# Patient Record
Sex: Male | Born: 1975
Health system: Southern US, Community
[De-identification: ages and names within clinical notes are randomized; demographics above are authoritative.]

## PROBLEM LIST (undated history)

## (undated) DIAGNOSIS — F259 Schizoaffective disorder, unspecified: Secondary | ICD-10-CM

## (undated) HISTORY — DX: Schizoaffective disorder, unspecified: F25.9

## (undated) HISTORY — PX: NO PAST SURGERIES: SHX2092

---

## 2007-03-09 ENCOUNTER — Other Ambulatory Visit: Payer: Self-pay

## 2007-03-10 ENCOUNTER — Inpatient Hospital Stay (HOSPITAL_COMMUNITY): Admission: AD | Admit: 2007-03-10 | Discharge: 2007-03-15 | Payer: Self-pay | Admitting: Psychiatry

## 2007-03-10 ENCOUNTER — Ambulatory Visit: Payer: Self-pay | Admitting: Psychiatry

## 2008-05-10 ENCOUNTER — Encounter: Payer: Self-pay | Admitting: Pulmonary Disease

## 2008-05-20 ENCOUNTER — Encounter: Payer: Self-pay | Admitting: Pulmonary Disease

## 2008-05-25 ENCOUNTER — Encounter: Payer: Self-pay | Admitting: Pulmonary Disease

## 2008-06-22 ENCOUNTER — Encounter: Payer: Self-pay | Admitting: Pulmonary Disease

## 2008-11-10 ENCOUNTER — Encounter: Payer: Self-pay | Admitting: Pulmonary Disease

## 2009-10-10 ENCOUNTER — Encounter: Payer: Self-pay | Admitting: Pulmonary Disease

## 2009-11-29 ENCOUNTER — Ambulatory Visit: Payer: Self-pay | Admitting: Pulmonary Disease

## 2009-11-29 DIAGNOSIS — R0989 Other specified symptoms and signs involving the circulatory and respiratory systems: Secondary | ICD-10-CM

## 2009-11-29 DIAGNOSIS — G4733 Obstructive sleep apnea (adult) (pediatric): Secondary | ICD-10-CM

## 2009-11-29 DIAGNOSIS — R0609 Other forms of dyspnea: Secondary | ICD-10-CM

## 2009-11-29 DIAGNOSIS — E785 Hyperlipidemia, unspecified: Secondary | ICD-10-CM

## 2009-11-29 DIAGNOSIS — K219 Gastro-esophageal reflux disease without esophagitis: Secondary | ICD-10-CM

## 2009-11-29 DIAGNOSIS — F259 Schizoaffective disorder, unspecified: Secondary | ICD-10-CM | POA: Insufficient documentation

## 2009-12-13 DIAGNOSIS — J984 Other disorders of lung: Secondary | ICD-10-CM

## 2009-12-21 ENCOUNTER — Encounter: Payer: Self-pay | Admitting: Pulmonary Disease

## 2010-01-02 ENCOUNTER — Ambulatory Visit: Payer: Self-pay | Admitting: Pulmonary Disease

## 2010-01-11 ENCOUNTER — Ambulatory Visit (HOSPITAL_COMMUNITY)
Admission: RE | Admit: 2010-01-11 | Discharge: 2010-01-11 | Payer: Self-pay | Source: Home / Self Care | Attending: Pulmonary Disease | Admitting: Pulmonary Disease

## 2010-01-19 ENCOUNTER — Encounter: Payer: Self-pay | Admitting: Pulmonary Disease

## 2010-01-20 ENCOUNTER — Telehealth: Payer: Self-pay | Admitting: Pulmonary Disease

## 2010-01-23 ENCOUNTER — Encounter: Payer: Self-pay | Admitting: Pulmonary Disease

## 2010-02-14 NOTE — Letter (Signed)
Summary: [facility]   Imported By: Lester Blackshear 12/19/2009 08:00:46  _____________________________________________________________________  External Attachment:    Type:   Image     Comment:   External Document

## 2010-02-14 NOTE — Letter (Signed)
Summary: [facility]  [facility]   Imported By: Lester Cornell 12/19/2009 07:55:44  _____________________________________________________________________  External Attachment:    Type:   Image     Comment:   External Document

## 2010-02-14 NOTE — Letter (Signed)
Summary: [facility]  [address]   [place], [place] [address]   Phone: [phone]  Fax: [phone]    12/21/2009  Bobbi Kitner [address] [place], [place]  [address]  Dear Mr. Lasala,  Our office has attempted to call you at your home phone number multiple times and have been unable to reach you. If you would please give our office a call back as soon as posiible. Our office hours are from 8:00 a.m. to 5:30 p.m. Monday through Friday.          Sincerely,   [employer]

## 2010-02-16 NOTE — Assessment & Plan Note (Signed)
Summary: extended followup visit for management of osa   Copy to:  self referral Primary Provider/Referring Provider:  Dr. Carlota Raspberry - [facility]  CC:  Follow up appt.  Has decreased smoking down to 2 to 3 cigs daily.  Pt states breathing has improved since last ov.   Pt states he is also here to discuss sleep issues..  History of Present Illness: The pt comes in today for evaluation of his osa.  Last visit, we discussed his pulmonary issues, and he feels his breathing is much better since he has cut back on smoking.  The pt was diagnosed with severe osa in 2010 with AHI 49/hr, and was ultimately titrated to bilevel pressure of 10/6 in order to control obstructive events and reduce pressure induced central apneas.  He has been compliant with bipap since starting, and feels it has helped his sleep and daytime alertness.  He is using a full face mask that he feels fits adequately, and has a heated humidifier.  He typically goes to bed 11-102mn, and arises at 11-12nn to start his day.  He has significant daytime sleepiness that he believes is due to his sedating meds.  He uses provigil to help with his daytime sleepiness, but believes the bipap has helped as well.  Once he is up and moving, he does not doze.  He felt he was much more rested before he got started on his current meds.  The pt states that his weight is up about 10 pounds since his sleep study.  His epworth score today is 7.    Current Medications (verified): 1)  Lithium Carbonate 450 Mg Cr-Tabs (Lithium Carbonate) .... Take 1 Tablet By Mouth Two Times A Day 2)  Clozaril 100 Mg Tabs (Clozapine) .... As Directed 3)  Levothyroxine Sodium 50 Mcg Tabs (Levothyroxine Sodium) .... Take 1 Tablet By Mouth Once A Day 4)  Divalproex Sodium 500 Mg Tbec (Divalproex Sodium) .... 2 in Am and 3 At Bedtime 5)  Nexium 40 Mg Cpdr (Esomeprazole Magnesium) .... Take 1 Tablet By Mouth Once A Day 6)  Nac 600 600 Mg Caps (Acetylcysteine) ....  2 Two Times A Day 7)  Aspirin 500 Mg Tbec (Aspirin) .... Take 1 Tablet By Mouth Two Times A Day 8)  Propranolol Hcl 40 Mg Tabs (Propranolol Hcl) .... 2 Two Times A Day 9)  Provigil 200 Mg  Tabs (Modafinil) .... By Mouth Daily  Allergies (verified): No Known Drug Allergies  Past History:  Past medical, surgical, family and social histories (including risk factors) reviewed, and no changes noted (except as noted below).  Past Medical History: Reviewed history from 11/29/2009 and no changes required. ACID REFLUX DISEASE (ICD-530.81) SCHIZOAFFECTIVE DISORDER (ICD-295.70) OBSTRUCTIVE SLEEP APNEA (ICD-327.23)--also central apneas as well. AHI 49/hr 2010 HYPERLIPIDEMIA (ICD-272.4)  Past Surgical History: Reviewed history from 11/29/2009 and no changes required. none  Family History: Reviewed history from 11/29/2009 and no changes required. mother - kidney cancer PGF - deceased from lung cancer PGM - deceased from pancreatic cancer  Social History: Reviewed history from 11/29/2009 and no changes required. Current Smoker.  smoking 4-5 cig/day.  Currently down to 2 to 3 cigs a day.   Started at age 86 Smokeless Tobacco - 2 cans per day.  Started 10/2009 Married Lives with wife and 4 children Disability  Review of Systems       The patient complains of shortness of breath with activity, nasal congestion/difficulty breathing through nose, anxiety, and depression.  The patient  denies shortness of breath at rest, productive cough, non-productive cough, coughing up blood, chest pain, irregular heartbeats, acid heartburn, indigestion, loss of appetite, weight change, abdominal pain, difficulty swallowing, sore throat, tooth/dental problems, headaches, sneezing, itching, ear ache, hand/feet swelling, joint stiffness or pain, rash, change in color of mucus, and fever.    Vital Signs:  Patient profile:   35 year old male Height:      70 inches Weight:      245.25 pounds BMI:     35.32 O2  Sat:      98 % on Room air Temp:     97.8 degrees F oral Pulse rate:   83 / minute BP sitting:   124 / 76  (left arm) Cuff size:   large  Vitals Entered By: Arman Filter LPN (January 02, 2010 2:12 PM)  O2 Flow:  Room air CC: Follow up appt.  Has decreased smoking down to 2 to 3 cigs daily.  Pt states breathing has improved since last ov.   Pt states he is also here to discuss sleep issues. Comments Medications reviewed with patient Arman Filter LPN  January 02, 2010 2:12 PM    Physical Exam  General:  obese male in nad Nose:  no skin breakdown or pressure necrosis from cpap mask no drainage noted. Mouth:  no lesions or exudates seen Lungs:  clear to auscultation Heart:  rrr Extremities:  no edema or cyanosis  Neurologic:  appears sleepy, but oriented, moves all 4.   Impression & Recommendations:  Problem # 1:  OBSTRUCTIVE SLEEP APNEA (ICD-327.23) the pt has a h/o severe osa, and has been wearing bipap complaintly by his history.  He is comfortable with his mask, and has no issues with pressure tolerance.  He has seen improvement since being on bipap, but feels that his sleepiness has significantly worsened since being on his new meds.  I have asked the pt to stay on his pressure device and work aggressively on weight loss.  If he feels that he is not sleeping well, we can recheck his pressure with an auto device.  He understands that adjustments to his bipap is not going to affect his sleepiness during the day from his psychiatric meds.  Problem # 2:  PULMONARY NODULE (ICD-518.89) pt is overdue for f/u ct chest.  Will set up.  Other Orders: Est. Patient Level V ([address]) Radiology Referral (Radiology)  Patient Instructions: 1)  will continue bipap at current level, and get download for review.  If you feel you are not doing well, we can recheck pressures at sleep center.  Working on weight loss will help 2)  stay off cigarettes as much as possible. 3)  followup with me in  6mos, but call if having sleep or breathing issues. 4)  will set up for ct chest at [facility] at your convenience.  Will call you with results.

## 2010-02-16 NOTE — Assessment & Plan Note (Signed)
Summary: self referral for doe   Copy to:  self referral Primary Jowana Thumma/Referring Danissa Rundle:  Dr. Carlota Raspberry - [facility]  CC:  shortness of breath.  History of Present Illness: the pt is a 35 y/o male who comes in today as a self referral for evaluation of dyspnea.  He has had doe for at least the last two years, and states the it occurs primarily with more moderate to heavy activities.  He has no issues with walking on flat ground, bringing groceries in from the car, or household chores.  He will have issues with stairs, hills, and carrying heavier loads.  He denies significant cough or congestion, and states these have improved since decreased smoking.  He currently is trying to quit.  He has been tried on inhalers in the past, and did not see a big difference.  Albuterol alone as needed helped in some circumstances.  He has had full pfts last year which showed no airflow obstruction, essentially normal lung volumes, and a mild decrease in dlco that corrects for alveolar volume.  He had a ct chest in 2010 that did show only a 5mm nodule in the RML.    Preventive Screening-Counseling & Management  Alcohol-Tobacco     Smoking Status: current     Smoking Cessation Counseling: yes     Packs/Day: 0.25     Tobacco Counseling: to quit use of tobacco products  Current Medications (verified): 1)  Lithium Carbonate 450 Mg Cr-Tabs (Lithium Carbonate) .... Take 1 Tablet By Mouth Two Times A Day 2)  Clozaril 100 Mg Tabs (Clozapine) .... As Directed 3)  Levothyroxine Sodium 50 Mcg Tabs (Levothyroxine Sodium) .... Take 1 Tablet By Mouth Once A Day 4)  Divalproex Sodium 500 Mg Tbec (Divalproex Sodium) .... 2 in Am and 3 At Bedtime 5)  Provigil 200 Mg Tabs (Modafinil) .... Take 1 Tablet By Mouth Every Morning As Needed 6)  Nexium 40 Mg Cpdr (Esomeprazole Magnesium) .... Take 1 Tablet By Mouth Once A Day 7)  Nac 600 600 Mg Caps (Acetylcysteine) .... 2 Two Times A Day 8)  Aspirin 500  Mg Tbec (Aspirin) .... Take 1 Tablet By Mouth Two Times A Day 9)  Propranolol Hcl 40 Mg Tabs (Propranolol Hcl) .... 2 Two Times A Day 10)  Astepro 0.15 % Soln (Azelastine Hcl) .... As Needed  Allergies (verified): No Known Drug Allergies  Past History:  Past Medical History: ACID REFLUX DISEASE (ICD-530.81) SCHIZOAFFECTIVE DISORDER (ICD-295.70) OBSTRUCTIVE SLEEP APNEA (ICD-327.23)--also central apneas as well. AHI 49/hr 2010 HYPERLIPIDEMIA (ICD-272.4)  Past Surgical History: none  Family History: Reviewed history and no changes required. mother - kidney cancer PGF - deceased from lung cancer PGM - deceased from pancreatic cancer  Social History: Reviewed history and no changes required. Current Smoker.  Currently smoking 4-5 cig/day.  Started at age 33 Smokeless Tobacco - 2 cans per day.  Started 10/2009 Married Lives with wife and 4 children Disability Smoking Status:  current Packs/Day:  0.25  Review of Systems       The patient complains of shortness of breath with activity, productive cough, acid heartburn, indigestion, nasal congestion/difficulty breathing through nose, and anxiety.  The patient denies shortness of breath at rest, non-productive cough, coughing up blood, chest pain, loss of appetite, weight change, abdominal pain, difficulty swallowing, sore throat, tooth/dental problems, headaches, sneezing, itching, ear ache, depression, hand/feet swelling, joint stiffness or pain, rash, and change in color of mucus.    Vital Signs:  Patient  profile:   35 year old male Height:      70 inches Weight:      240.13 pounds BMI:     34.58 O2 Sat:      98 % on Room air Temp:     98.3 degrees F oral Pulse rate:   74 / minute BP sitting:   130 / 82  (left arm) Cuff size:   regular  Vitals Entered By: Gweneth Dimitri RN (November 29, 2009 11:47 AM)  O2 Flow:  Room air CC: shortness of breath Comments Medications reviewed with patient Daytime contact number verified  with patient. Gweneth Dimitri RN  November 29, 2009 11:47 AM    Physical Exam  General:  ow male in nad Eyes:  PERRLA and EOMI.   Nose:  turbinate hypertrophy Mouth:  long palate and uvula Neck:  no jvd, tmg, LN Lungs:  totally clear to auscultation, no wheezing or rhonchi Heart:  rrr,no mrg Abdomen:  soft and nontender, bs+ Extremities:  no edema or cyanosis , pulses intact distally Neurologic:  alert and oriented, moves all 4.   Impression & Recommendations:  Problem # 1:  DYSPNEA ON EXERTION (ICD-786.09) the pt has doe that I suspect is multifactorial.  He is overweight and deconditioned, and may have airway inflammation related to his ongoing smoking.  He does not have airflow obstruction by his pfts in 2010, and his last ct in 2010 did not show any obvious abnormality which would contribute to his symptoms.  At this point, I have encouraged the pt to work aggressively on weight loss and some type of conditioning program.  I also have asked him to stop smoking 100%.  Problem # 2:  PULMONARY NODULE (ICD-518.89) the pt had a 5mm nodule in RML on ct from 2010.  This will need followup, and can be arranged at next visit.  Medications Added to Medication List This Visit: 1)  Lithium Carbonate 450 Mg Cr-tabs (Lithium carbonate) .... Take 1 tablet by mouth two times a day 2)  Clozaril 100 Mg Tabs (Clozapine) .... As directed 3)  Levothyroxine Sodium 50 Mcg Tabs (Levothyroxine sodium) .... Take 1 tablet by mouth once a day 4)  Divalproex Sodium 500 Mg Tbec (Divalproex sodium) .... 2 in am and 3 at bedtime 5)  Provigil 200 Mg Tabs (Modafinil) .... Take 1 tablet by mouth every morning as needed 6)  Nexium 40 Mg Cpdr (Esomeprazole magnesium) .... Take 1 tablet by mouth once a day 7)  Nac 600 600 Mg Caps (Acetylcysteine) .... 2 two times a day 8)  Aspirin 500 Mg Tbec (Aspirin) .... Take 1 tablet by mouth two times a day 9)  Propranolol Hcl 40 Mg Tabs (Propranolol hcl) .... 2 two times a  day 10)  Astepro 0.15 % Soln (Azelastine hcl) .... As needed 11)  Provigil 200 Mg Tabs (Modafinil) .... By mouth daily  Other Orders: New Patient Level IV (04540) Tobacco use cessation intermediate 3-10 minutes (98119)  Patient Instructions: 1)  stay off inhalers for now, but stop smoking completely when you are able. 2)  work on weight loss and conditioning. 3)  will arrange followup visit in the next few weeks to discuss your sleep issues.   Prescriptions: PROVIGIL 200 MG  TABS (MODAFINIL) By mouth daily  #30 x 0   Entered and Authorized by:   Barbaraann Share MD   Signed by:   Barbaraann Share MD on 11/29/2009   Method used:   Print  then Give to Patient   RxID:   [id number]     Appended Document: self referral for doe please let pt know his disk kept crashing our system.  He had a tiny spot on the ct from over a year ago....this needs a followup scan anyway.  We need to schedule him for a followup ct chest here in [place] if possible.  See what time or day may be best for him.  Appended Document: self referral for doe lmomtcb x1  Appended Document: self referral for doe lmomtcb x2  Appended Document: self referral for doe lmomtcb x3   Appended Document: self referral for doe atempt to call pt. i called twice and someone kept hanging up. will sned a letter out to pt's home stating for him to call us.   Appended Document: self referral for doe pt called back and informed him. pt verbalized understanding. Pt states he is able to do this anytime after 12/30/2009. Pt was informed our pcc should be contacting him with furthur details of date and timre. pt verbalized understanding

## 2010-02-16 NOTE — Progress Notes (Signed)
Summary: CXR results  Phone Note Call from Patient   Caller: Patient Call For: Dr. Shelle Iron Summary of Call: Patient phoned stated that he had a chest xray last week and wants to know if we have the results yet. Patient can be reached [phone] Initial call taken by: Vedia Coffer,  January 20, 2010 3:58 PM  Follow-up for Phone Call        LMTCBx1 to advsie of ct results per append. Carron Curie CMA  January 20, 2010 4:30 PM  Pt informed of 01/12/11 append of CXR. Zackery Barefoot CMA  January 23, 2010 2:47 PM

## 2010-02-16 NOTE — Letter (Signed)
Summary: [facility]  [address]   [place], [place] [address]   Phone: [phone]  Fax: [phone]    01/19/2010  Essa Visscher [address] [place], [place]  [address]  Dear Mr. Joo,     We have attempted to contact you by phone but have been unable to reach you.  Please call our office at your earliest convenience so that we may discuss your test results with you.  Thank you.       Sincerely,   Marcelyn Bruins, M.D.

## 2010-05-30 NOTE — H&P (Signed)
Matthew Reed, Matthew Reed NO.:  [id number]   MEDICAL RECORD NO.:  [id number]          PATIENT TYPE:  IPS   LOCATION:  0406                          FACILITY:  [facility]   PHYSICIAN:  Jasmine Pang, M.D. DATE OF BIRTH:  15-Apr-1975   DATE OF ADMISSION:  03/10/2007  DATE OF DISCHARGE:                       PSYCHIATRIC ADMISSION ASSESSMENT   HISTORY OF PRESENT ILLNESS:  The patient presents with a history of  anxiety and obsessive thoughts and his sleep has been decreased.  He  reports becoming suicidal and having homicidal thoughts towards his  wife.  He does state that he went ahead and called 9-1-1 because he knew  he needed help.  He has been feeling very anxious, having some recent  medication changes where his Depakote was decreased and had an increase  in his Luvox dose.  The patient reports having some recent alcohol use.  He is endorsing problems of social anxiety and is currently denying any  hallucinations.   PAST PSYCHIATRIC HISTORY:  First admission to the [facility].  He sees Dr. Jennelle Human for outpatient mental health services.  He  has a history of overdosing on Tegretol 1 year ago and was hospitalized  at [facility].   SOCIAL HISTORY:  He is a 35 year old married male and has 3 children  ages 72, 2 and 1.  He lives with his wife and 3 children.  The patient is  on disability for psychiatric illness.   FAMILY HISTORY:  Family history is positive for substance use.   ALCOHOL AND DRUG HISTORY:  The patient smokes.  Again, reports some  recent alcohol use and has a past history of using benzodiazepines.   PRIMARY CARE PHYSICIAN:  Primary care France Noyce is [facility].   PAST MEDICAL HISTORY:  Acid reflux and a hiatal hernia.   MEDICATIONS:  1. He has been on Depakote 500 mg two in the morning and three at      bedtime.  2. Nexium 40 mg daily.  3. Zyprexa 20 mg at bedtime.  4. Fluvoxamine 100 mg b.i.d.   DRUG  ALLERGIES:  No known allergies.   PHYSICAL EXAM:  GENERAL:  This is a young male.  He appears in no acute  distress.  He was assessed at [facility] emergency department.  VITAL SIGNS:  Temperature 98.4, 94 heart rate, 20 respirations, blood  pressure is 110/74, 99% saturated, 235 pounds.   LABORATORY DATA:  Glucose of 107.  CBC within normal limits.  Urine drug  screen is negative.  Alcohol level of 113.   MENTAL STATUS EXAM:  He is fully alert, cooperative, fair eye contact,  casually dressed.  Speech is clear, normal pace and tone.  The patient's  mood is anxious.  The patient does exhibit some mild anxiety.  Thought  processes are coherent and goal directed.  No evidence of any delusional  thinking.  Cognitive function intact.  His memory is good.  Judgment and  insight fair.  He is wanting help with his symptoms.   AXIS I:  Schizoaffective disorder bipolar type.  AXIS II:  Deferred.  AXIS III:  Acid reflux.  AXIS IV:  Psychosocial problems related to burden of illness.  AXIS V:  Current is 35.   PLAN:  Contract for safety.  Stabilize his mood and thinking.  We will  decrease Luvox to 150 mg daily.  We will initiate Risperdal for anxiety  and obsessive thinking, his racing thoughts.  Will contact wife for  background, support and concerns.  The patient is to follow up with Dr.  Jennelle Human and continue to be medication compliant and adhere to his  appointments.  His tentative length of stay is 3-5 days.      Landry Corporal, N.P.      Jasmine Pang, M.D.  Electronically Signed    JO/MEDQ  D:  03/11/2007  T:  03/12/2007  Job:  [id number]

## 2010-06-02 NOTE — Discharge Summary (Signed)
Matthew Reed, Matthew Reed NO.:  [id number]   MEDICAL RECORD NO.:  [id number]          PATIENT TYPE:  IPS   LOCATION:  0406                          FACILITY:  [facility]   PHYSICIAN:  Anselm Jungling, MD  DATE OF BIRTH:  28-Nov-1975   DATE OF ADMISSION:  03/10/2007  DATE OF DISCHARGE:  03/15/2007                               DISCHARGE SUMMARY   IDENTIFYING DATA AND REASON FOR ADMISSION:  This was the first  [facility] admission for Matthew Reed, a 35 year old married  white male, disabled, with a history of schizophrenia.  He is a patient  of Dr. Jennelle Human.  He presented with an increase in his symptoms, including  anxiety, insomnia, and irritability, which followed a decrease in his  Depakote dose at the instruction of his psychiatrist.  His medication  regimen included Depakote, Zyprexa, and Luvox.  Please refer to the  admission note for further details pertaining to the symptoms,  circumstances, and history that led to his hospitalization.  He was  given an initial Axis I diagnosis of bipolar disorder, NOS.   MEDICAL AND LABORATORY:  The patient was medically and physically  assessed by the psychiatric nurse practitioner.  He came to [place] with a  history of GERD, and had been taking Nexium.  Protonix was substituted.  Other than this, there were no significant medical issues.   HOSPITAL COURSE:  The patient was admitted to the adult inpatient  psychiatric service.  He presented as a well-nourished, well-developed  male who was alert, fully oriented, with good insight.  He made no  delusional statements.  He denied any active auditory hallucinations.  He was pleasant, but his mood was depressed with grim affect.  He  verbalized a strong desire for help.   The patient's Depakote level was measured and found to be  subtherapeutic.  His dose was increased to 3000 mg daily.  He was not  treated with Zyprexa, indicating that he felt that this had been  unsatisfactory  in the past.  Instead, he was treated with Seroquel, and  dosage was adjusted to a final level of 25 mg t.i.d. and 100 mg q.h.s.   The patient participated in various therapeutic groups and activities  and was a good participant.  There was a family session that occurred on  the third hospital day that involved his wife.  She was very supportive.  Aftercare plans were discussed at length.   The patient appeared appropriate for discharge on the sixth hospital  day.  He agreed to the following aftercare plan.   AFTERCARE:  The patient was to follow up with Dr. Jennelle Human at the  [facility], with an appointment on March 25, 2007 at  3:45 p.m.   DISCHARGE MEDICATIONS:  1. Depakote ER 3000 mg q.h.s.  2. Seroquel 25 mg b.i.d. and 100 mg q.h.s.  3. Nexium 40 mg daily.   DISCHARGE DIAGNOSES:   AXIS I:  Bipolar affective disorder, most recently depressed without  psychotic features.   AXIS II:  Deferred.   AXIS III:  History of gastroesophageal reflux  disease.   AXIS IV:  Stressors, severe.   AXIS V:  GAF on discharge 60.      Anselm Jungling, MD  Electronically Signed     SPB/MEDQ  D:  03/16/2007  T:  03/17/2007  Job:  [id number]

## 2010-10-09 LAB — VALPROIC ACID LEVEL
Valproic Acid Lvl: 42.6 — ABNORMAL LOW
Valproic Acid Lvl: 78.4
Valproic Acid Lvl: 87.1

## 2010-10-09 LAB — CBC
HCT: 45.7
Hemoglobin: 16.1
MCV: 90.2
Platelets: 164

## 2010-10-09 LAB — RAPID URINE DRUG SCREEN, HOSP PERFORMED
Amphetamines: NOT DETECTED
Benzodiazepines: NOT DETECTED
Cocaine: NOT DETECTED
Opiates: NOT DETECTED

## 2010-10-09 LAB — BASIC METABOLIC PANEL
Calcium: 9.5
Chloride: 105
Creatinine, Ser: 0.94
GFR calc Af Amer: >60
Potassium: 4.1
Sodium: 141

## 2010-10-09 LAB — DIFFERENTIAL
Basophils Absolute: 0.1
Lymphocytes Relative: 45
Lymphs Abs: 2.1
Monocytes Absolute: 0.4
Neutrophils Relative %: 45

## 2010-10-09 LAB — ETHANOL: Alcohol, Ethyl (B): 113 — ABNORMAL HIGH

## 2012-06-10 IMAGING — CT CT CHEST W/O CM
2 of 3 series · 15 of 36 positions shown, 18 images · non-contrast
Comparison: None

CLINICAL DATA: Evaluate right middle lobe nodule.

CT CHEST WITHOUT CONTRAST
TECHNIQUE: Multidetector CT imaging of the chest was performed
following the standard protocol without IV contrast.

[Series 2: chestroutine 5.0 b40f · axial · 0.74mm/px · z∈[-314,-68]mm · 12 of 59 slices shown, 15 images]
[im 5/59  mediastinal]
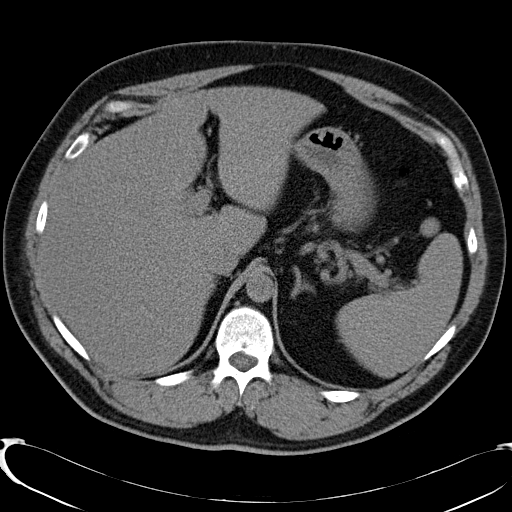
[im 5/59  lung]
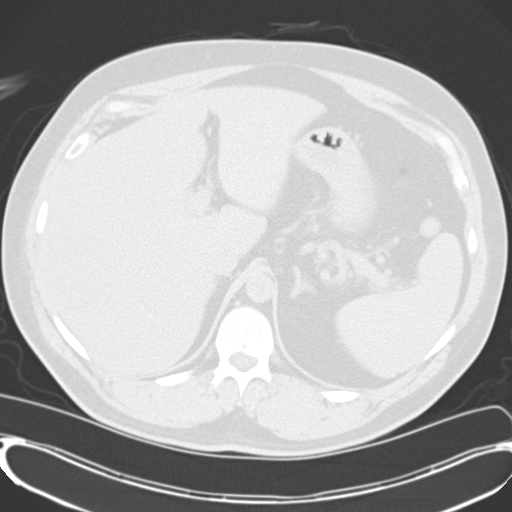
[im 9/59  lung]
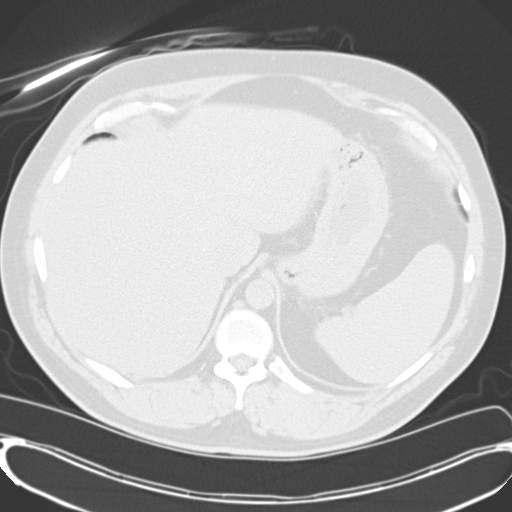
[im 13/59  lung]
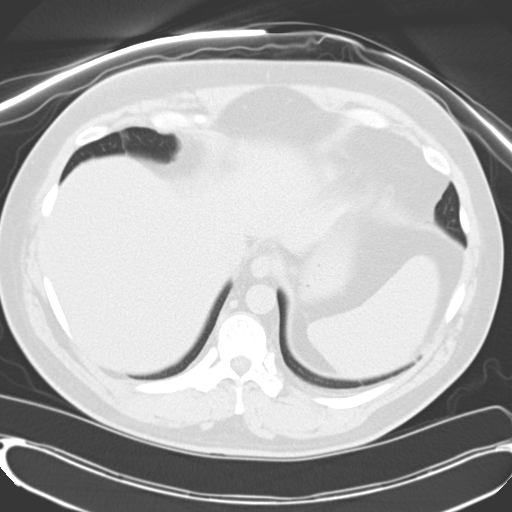
[im 18/59  lung]
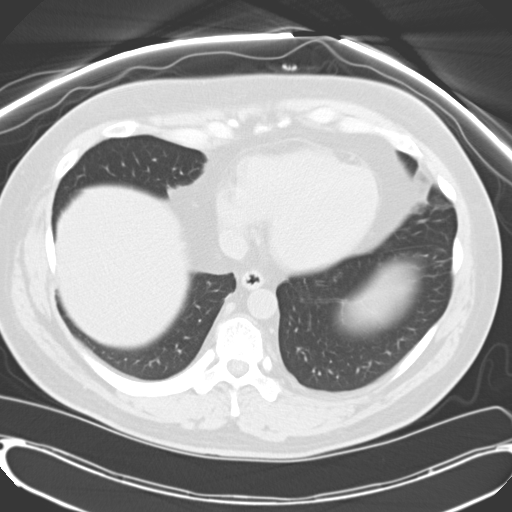
[im 22/59  mediastinal]
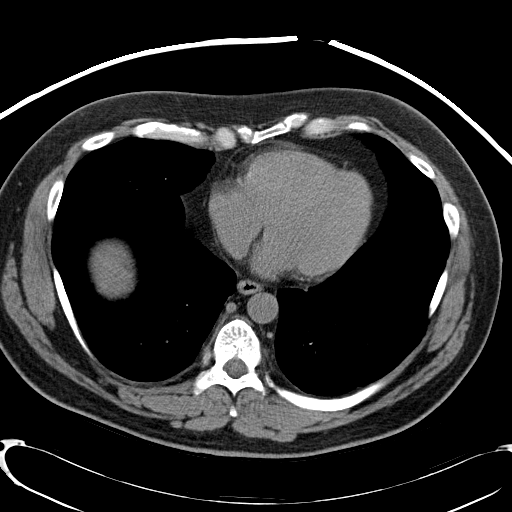
[im 22/59  lung]
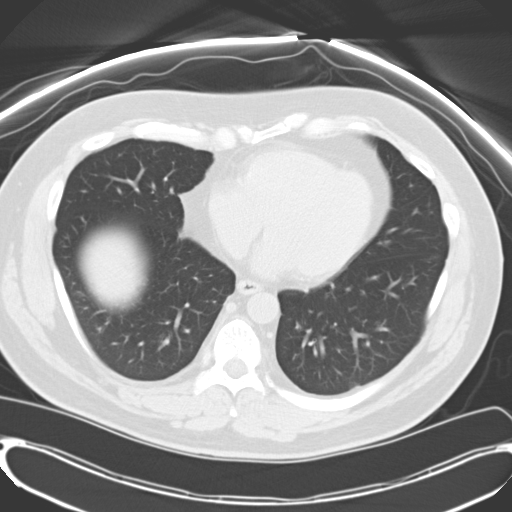
[im 26/59  lung]
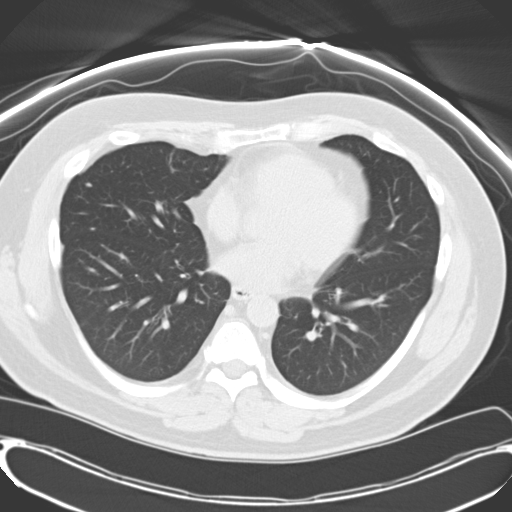
[im 33/59  lung]
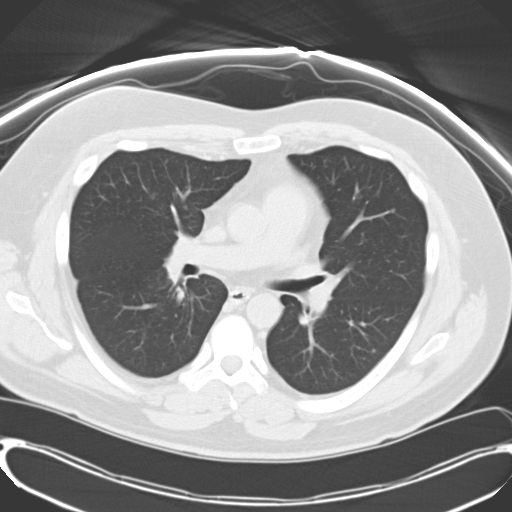
[im 37/59  lung]
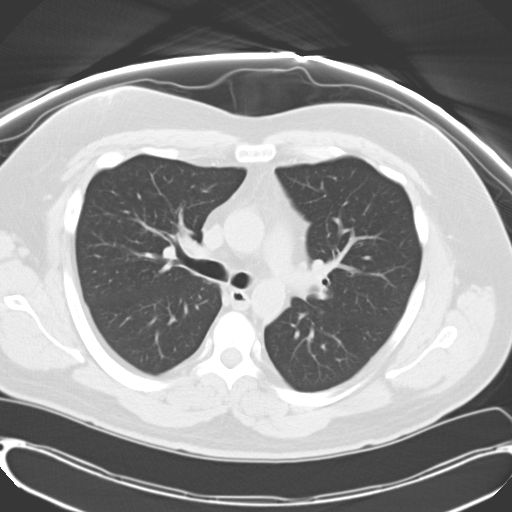
[im 41/59  mediastinal]
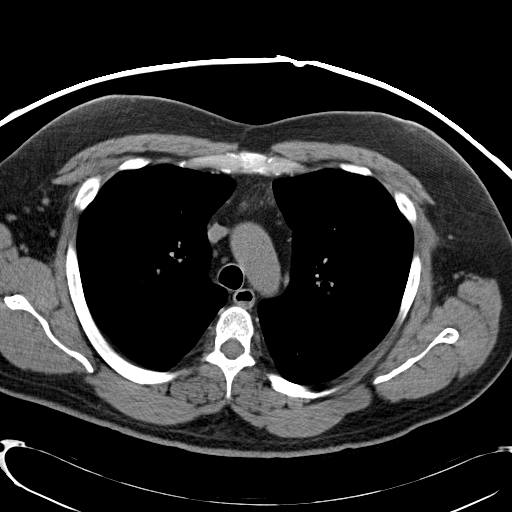
[im 41/59  lung]
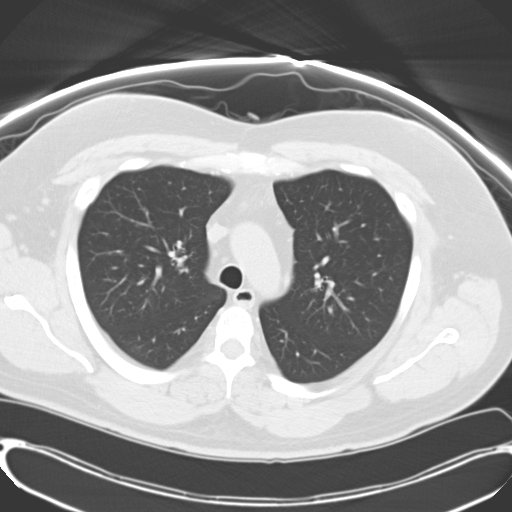
[im 46/59  lung]
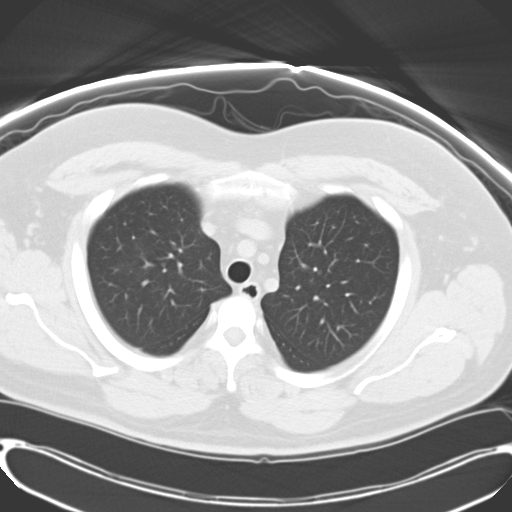
[im 50/59  lung]
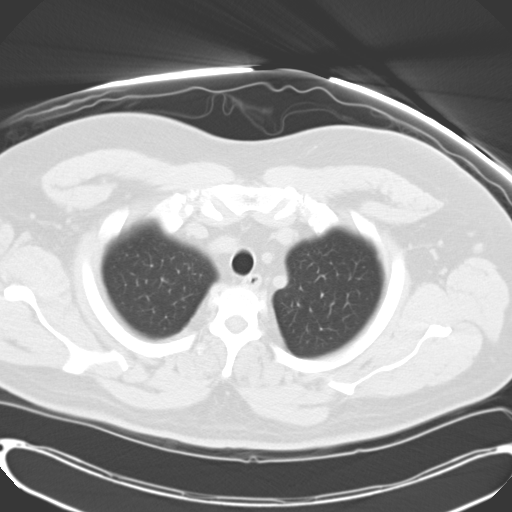
[im 54/59  lung]
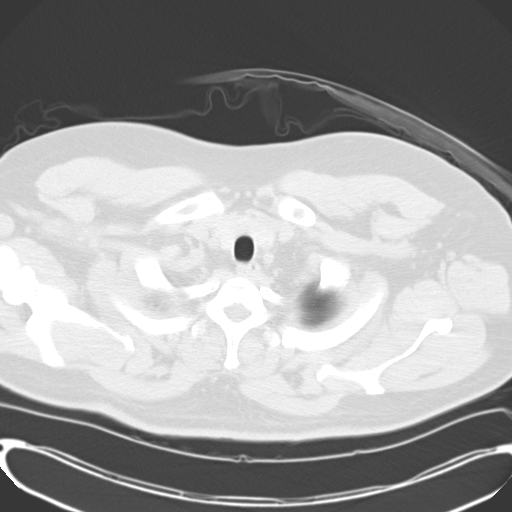

[Series 4: mpr coro 3mm · coronal · 0.59mm/px · 3 of 81 slices shown]
[im 17/81  lung]
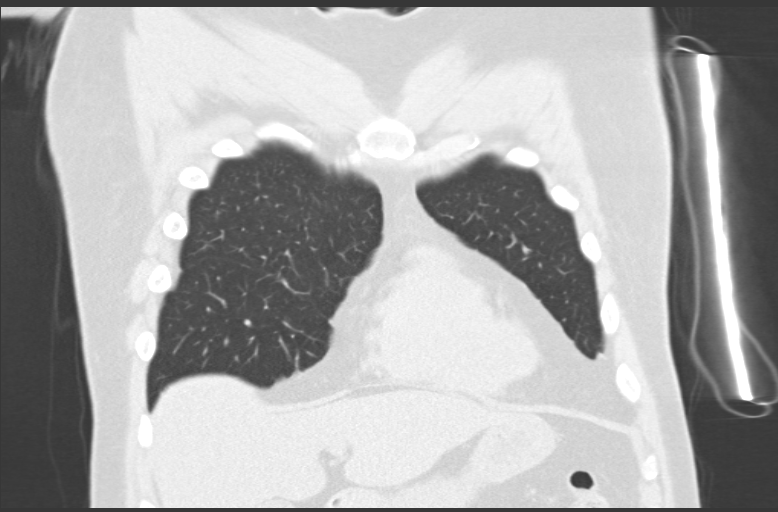
[im 33/81  lung]
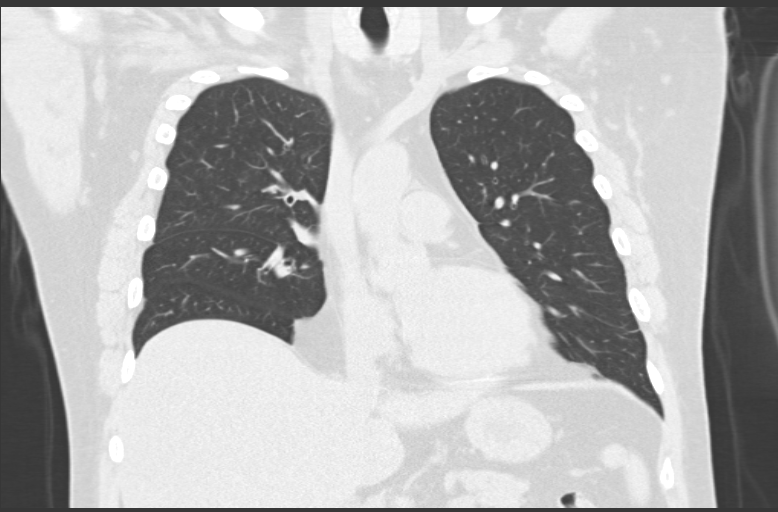
[im 49/81  lung]
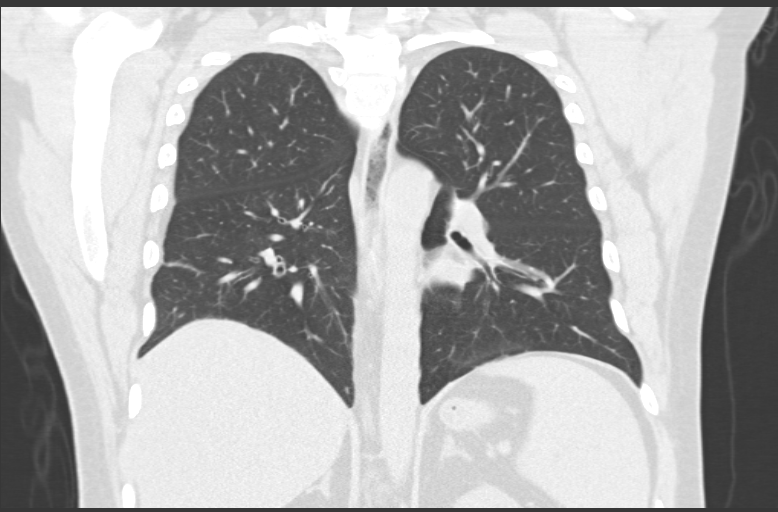

[15 of 36 positions shown; findings below may reference images not displayed]

FINDINGS: There is a nodule in the right middle lobe measuring
approximately 3 x 5 mm.  This has a discoid appearance and is not
round.  This is associated with a small septation in the lung but
not associated with the minor fissure.  This may be a lymph node.
Based on the morphology this is most likely benign.  Correlation
with prior studies for stability is suggested.  No other lung
nodule is identified.  Linear  scar or atelectasis in the right
lung base.

No mass or adenopathy.  Lungs are clear without infiltrate or
effusion.  Degenerative disc disease in the thoracic spine is noted
without fracture.
IMPRESSION: Discoid shaped nodule the right middle lobe, likely a lymph node.
This has a benign appearance.  Correlation with prior studies is
suggested to confirm stability.

## 2012-12-01 ENCOUNTER — Ambulatory Visit (INDEPENDENT_AMBULATORY_CARE_PROVIDER_SITE_OTHER): Payer: Medicare Other | Admitting: Pulmonary Disease

## 2012-12-01 ENCOUNTER — Encounter: Payer: Self-pay | Admitting: Pulmonary Disease

## 2012-12-01 VITALS — BP 132/90 | HR 109 | Temp 98.0°F | Ht 70.0 in | Wt 206.0 lb

## 2012-12-01 DIAGNOSIS — G4733 Obstructive sleep apnea (adult) (pediatric): Secondary | ICD-10-CM

## 2012-12-01 NOTE — Progress Notes (Signed)
  Subjective:    Patient ID: Matthew Reed, male    DOB: 27-Nov-1975, 37 y.o.   MRN: [id number]  HPI Patient comes in today for followup of his obstructive sleep apnea.  He has not been seen in 3-4 years, but had stayed on his bilevel device until most recently.  His mask has fallen apart, and he needs new supplies as well.  The patient tells me that he has lost 40 pounds since his last visit, and this is documented by review of the chart.  He tells me that he is not sleeping as well, nor is he has rested since he has been off CPAP.   Review of Systems  Constitutional: Negative for fever and unexpected weight change.  HENT: Negative for congestion, dental problem, ear pain, nosebleeds, postnasal drip, rhinorrhea, sinus pressure, sneezing, sore throat and trouble swallowing.   Eyes: Negative for redness and itching.  Respiratory: Negative for cough, chest tightness, shortness of breath and wheezing.   Cardiovascular: Negative for palpitations and leg swelling.  Gastrointestinal: Negative for nausea and vomiting.  Genitourinary: Negative for dysuria.  Musculoskeletal: Negative for joint swelling.  Skin: Negative for rash.  Neurological: Negative for headaches.  Hematological: Does not bruise/bleed easily.  Psychiatric/Behavioral: Negative for dysphoric mood. The patient is not nervous/anxious.        Objective:   Physical Exam Overweight male in no acute distress Nose without purulence or discharge noted No skin breakdown or pressure necrosis from the CPAP mask Neck without lymphadenopathy or thyromegaly Lower extremities without edema, no cyanosis Alert and oriented, does not appear to be sleepy, moves all 4 extremities.       Assessment & Plan:

## 2012-12-01 NOTE — Patient Instructions (Signed)
Will order new supplies and mask  Since you have lost considerable weight, will optimize your pressure again with a loaner bipap device for a few weeks.  Will call once I receive your download to let you know your pressures.  Keep working on weight loss. followup with me in one year.

## 2012-12-01 NOTE — Assessment & Plan Note (Signed)
The patient has a history of severe obstructive sleep apnea, and could not tolerate CPAP because of pressure induced central apneas.  He was titrated on bilevel, and did well with this.  He has not kept up with his mask changes or supplies, and they have basically fallen apart.  He has been unable to use his device since this occurred.  Will arrange for a new mask and supplies, and we'll also optimize his pressure again since he has lost 40 pounds.  The patient needs to followup with me on a yearly basis.

## 2012-12-03 ENCOUNTER — Telehealth: Payer: Self-pay | Admitting: Pulmonary Disease

## 2012-12-03 NOTE — Telephone Encounter (Signed)
I spoke with tnnille and made her aware. Nothing further needed

## 2012-12-03 NOTE — Telephone Encounter (Signed)
Per KC, DME needs to provide BiPAP AUTO device loaner for pt to use for the next 2 weeks.  D/L needs to be faxed to our office after 2 weeks.

## 2012-12-03 NOTE — Telephone Encounter (Signed)
I spoke with Matthew Reed. She received an order for pt to be placed on auto bpap. Pt has BIPAP ST and this does not do auto setting. Pt has an appt with them at 11 AM and is requesting a response before pt shows up. Please advise KC thanks

## 2012-12-24 ENCOUNTER — Telehealth: Payer: Self-pay | Admitting: Pulmonary Disease

## 2012-12-24 DIAGNOSIS — G4733 Obstructive sleep apnea (adult) (pediatric): Secondary | ICD-10-CM

## 2012-12-24 NOTE — Telephone Encounter (Signed)
KC, please advise if you are okay with this?

## 2012-12-25 NOTE — Telephone Encounter (Signed)
Called and spoke with [facility] and they will fax Auto BiPap download to [facility] fax #. Per [facility] the download is on an auto bipap from 11/19/12 to 12/18/12. Rhonda J Cobb

## 2012-12-25 NOTE — Telephone Encounter (Signed)
Did this order get taken care of? Thanks ladies!

## 2012-12-25 NOTE — Telephone Encounter (Signed)
At the visit 11/19 we ordered an auto bipap study at home for 2 weeks so we could optimize his pressure. We have to have the download to be able to know what to reset his bipap machine to.  What is the status of this?  Did they do it.  Have they picked up machine to get download?  Do we have it somewhere?

## 2012-12-26 NOTE — Telephone Encounter (Signed)
Called spoke with patient, advised we just received his download from [facility] and that it was given to [facility] nurse.  Advised pt that [facility] is seeing pt's this afternoon and we will be in touch regarding results/CPAP.  Dr Shelle Iron please advise, thank you. **will also forward to Ashtyn

## 2012-12-26 NOTE — Telephone Encounter (Signed)
Download from [facility] received and hand delivered to [facility]. Advised Ashtyn that patient had called inquiring about download and there is a phone message in the box. Rhonda J Cobb

## 2012-12-26 NOTE — Telephone Encounter (Signed)
In [facility] green folder for him to review.

## 2012-12-26 NOTE — Telephone Encounter (Signed)
Let the pt know and dme know that I am missing pages on the report.  I need the page that has how many minutes he spent at each pressure during the download period.   See if we can get this from the dme.   Let pt know will call once this is received.

## 2012-12-26 NOTE — Telephone Encounter (Signed)
Pt calling again in ref to previous msg can be reached at [phone].Raylene Everts

## 2012-12-30 ENCOUNTER — Telehealth: Payer: Self-pay | Admitting: Pulmonary Disease

## 2012-12-30 NOTE — Telephone Encounter (Signed)
Error.Matthew Reed ° °

## 2012-12-30 NOTE — Telephone Encounter (Signed)
Patient aware of the progress of D/L Pt to try to contact [facility] to help retreive these as well.

## 2012-12-30 NOTE — Telephone Encounter (Signed)
Unable to reach anyone d/t long waiting times on the phone. I have ATC x 3 times with no success. Request to be faxed URGENT for D/L

## 2012-12-31 NOTE — Telephone Encounter (Signed)
[email or web address].Jennifer Castillo, CMA  

## 2012-12-31 NOTE — Telephone Encounter (Signed)
I spoke with the pt and advised that Ashtyn spoke with the DME company this morning and is working with them on getting the information that is needed to properly read his download. I advised it looks like the DME has him on an incorrect setting or machine. I advised that we are looking into this and we will let him know.   I called [facility] and spoke with Taneal. She states the download that they sent this morning is as detailed as they can get. I advised her what Dr. Shelle Iron was looking for and she states she does not know how to do this.   I spoke with Dr. Shelle Iron and he states that looking at the download that was sent it shows the EPAP and IPAP # as being the same. He states this tells him that they have him on a bipap machine but with CPAP settings. They need to change this to IPAP 5-20 and EPAP 5-20. He also states that he wants a graph that states how much time he stayed on each setting. He states he has gotten this from other DME.   I called Taneal back and advised her of what KC stated. She looked at the download with me and stated she does nto know why this states this. She states she will call the [employer] and speak with them to see what can be done. She will also check to see if she can print the chart he is asking for. She will call us back to let us know. Per KC if they cannot get this straight then we need to change the pt to another company. Will await call back. Carron Curie, CMA

## 2012-12-31 NOTE — Telephone Encounter (Signed)
New fax received from Matthew Reed @ [facility]-- Data is still not correct. Patient was supposed to be on BiLevel AUTO Device x 2 weeks-- the data they are sending per [facility] is not correct. The IPAP and EPAP Pressure should be different, not the same.  The data they keep sending is CPAP LM for Matthew Reed to contact me

## 2012-12-31 NOTE — Telephone Encounter (Signed)
D/L received from [facility]. Given to [facility] to review.  Per KC needs chart of pressures and time spent at those pressures.

## 2012-12-31 NOTE — Telephone Encounter (Signed)
Matthew Reed from [employer] [facility] is calling requesting to speak w/ Matthew Reed & can be reached at [phone].  Matthew Reed

## 2012-12-31 NOTE — Telephone Encounter (Signed)
I spoke with Taneal again and she states she spoke with [employer] and they went into her computer and showed her how to get the graph that [facility] is requesting so she has this information. However the information is still not correct because the settings are not correct. This is not being understood, so KC advised to send an order for the pt to be placed on a [facility] Auto Bipap with settings of Ipap 5-20 and Epap 5-20 with 2 week download. If thsi cannot be done then pt needs to be changed to another DME. Order has been placed. I have [id number] to advise the pt of what is going on. Carron Curie, CMA

## 2012-12-31 NOTE — Telephone Encounter (Signed)
Pt is asking to speak w/ [facility] or his nurse regarding the same.  Pt is getting very upset with the delay stating that "everybody keeps pointing fingers at each other".  Please give pt an update asap.  Pt can be reached at [phone].  [name]

## 2012-12-31 NOTE — Telephone Encounter (Signed)
Returning call can be reached at [phone].Matthew Reed

## 2013-01-01 NOTE — Telephone Encounter (Signed)
I spoke with the pt and advised him of what was happening and that we ordered for him to be changed over to a resmed. Pt states understanding. Carron Curie, CMA

## 2013-01-02 ENCOUNTER — Telehealth: Payer: Self-pay | Admitting: Pulmonary Disease

## 2013-01-02 DIAGNOSIS — G4733 Obstructive sleep apnea (adult) (pediatric): Secondary | ICD-10-CM

## 2013-01-02 NOTE — Telephone Encounter (Signed)
I spoke with Tenille and she is needing clarification on the order that was sent for [facility]. She states that the issue was that they did not have the setting correct on the respironics, which we were aware of. Because of the issues that we were having with this Dr. Shelle Iron sent an order to have the pt changed over to a [facility] with IPAP/EPAP setting to 5-20. I spoke with Tenille and she states she cannot set the machine with 4 numbers, that she is needing a max and minumim pressure. Dr. Shelle Iron was saying that the settings are 5-20. To get clarification of what is needed I called [facility] and spoke with Darl Pikes, she states with the order that [facility] sent the range is 5-20 so the Max ipap will be 20 and minimum EPAP will be 5. I have called Tenille to advise and had to leave a message. When she calls back please get Victorino Dike to speak with her since this message has been so involved. Thanks. Carron Curie, CMA

## 2013-01-05 NOTE — Telephone Encounter (Signed)
ATC and was on hold to speak with Tenelle for 5 min WCB

## 2013-01-05 NOTE — Telephone Encounter (Signed)
Matthew Reed is returning triage's call.  [name]

## 2013-01-06 NOTE — Telephone Encounter (Signed)
I spoke with Tenelle. She reports they are setting pt up with teh resmed and will do IPAP max 20 and EPAP min 5. She reports this machine is also asking for a a pressure support setting as well. She thinks pt will do fine if we set this at 5. Please advise KC thanks

## 2013-01-06 NOTE — Telephone Encounter (Signed)
I am very concerned they do not know what they are doing.   I want the inspiratory pressure to be 5-20cm, and the expiratory pressure to vary 5-20cm.  I think they need to check with the resmed rep to find out how to set pressure support in order to achieve this. I do not give them a pressure support level, they have to set based on the inspiratory and expiratory parameters that I have given them.  If they can't do this, will send pt to another home care company.

## 2013-01-06 NOTE — Telephone Encounter (Signed)
PCC's please advise if we can have help with this is? thanks

## 2013-01-09 NOTE — Telephone Encounter (Signed)
Called [facility] to confirm that this machine was set correctly according to the below message from Dr. Shelle Iron. [facility] was closed today (01/09/13), answering service picked up. Will try back on Monday 01/12/13. Rhonda J Cobb

## 2013-01-12 NOTE — Telephone Encounter (Signed)
Matthew Reed said that the vpap is set up min epap 5 max ipap 20 and then a pressure support of 4 to set it on auto and he thinks most dme's do this but [facility] does not know your protocol so what they need is for it to say that on the order [name]

## 2013-01-12 NOTE — Telephone Encounter (Signed)
Advised them to call [email or web address]  and get advise from him as what to tell us about the resmed bipap she has to use for this study [name]

## 2013-01-14 NOTE — Telephone Encounter (Signed)
i need an order put in for this vpap the way it is written in this message thanks libby

## 2013-01-14 NOTE — Telephone Encounter (Signed)
Order placed. Latha Staunton, CMA  

## 2013-01-14 NOTE — Telephone Encounter (Signed)
That is fine with me.

## 2013-03-05 ENCOUNTER — Telehealth: Payer: Self-pay | Admitting: Pulmonary Disease

## 2013-03-05 DIAGNOSIS — G4733 Obstructive sleep apnea (adult) (pediatric): Secondary | ICD-10-CM

## 2013-03-05 NOTE — Telephone Encounter (Signed)
Spoke with the pt He states that he is needing results on [facility]AP [facility]  Have you seen these? Please advise thanks!

## 2013-03-06 NOTE — Telephone Encounter (Signed)
[facility] [place]/ They will be faxing over download today to [phone]. Will route message to Ashtyn to follow up on.

## 2013-03-06 NOTE — Telephone Encounter (Signed)
Have not 

## 2013-03-09 ENCOUNTER — Telehealth: Payer: Self-pay | Admitting: Pulmonary Disease

## 2013-03-09 NOTE — Telephone Encounter (Signed)
D/L received, in your Aarion Kittrell folder for review.

## 2013-03-09 NOTE — Telephone Encounter (Signed)
Fax received and given to ashtyn [facility] place [facility]. Please advise once looked at. thanks

## 2013-03-09 NOTE — Telephone Encounter (Signed)
See previous phone note.  

## 2013-03-09 NOTE — Telephone Encounter (Signed)
Here we go again.  There is a page missing off the download that shows how much time the pt spent at each individual pressure level over the course of the auto titration.  Please see if they will send this to us.

## 2013-03-09 NOTE — Telephone Encounter (Signed)
I called [facility] at [phone] They will refax download over to triage Will await fax

## 2013-03-11 ENCOUNTER — Encounter: Payer: Self-pay | Admitting: Pulmonary Disease

## 2013-03-11 NOTE — Telephone Encounter (Signed)
I spoke with patient about results and he verbalized understanding and had no questions Order placed 

## 2013-03-11 NOTE — Telephone Encounter (Signed)
Please let pt know that his download shows optimal bipap pressure to be 14/10.  I know this is more than prior pressure, but this appears to be accurate.  Good control of apnea, no significant mask leaks.  Please send an order to his dme to set his bipap machine on this pressure.

## 2013-12-01 ENCOUNTER — Encounter: Payer: Self-pay | Admitting: Pulmonary Disease

## 2013-12-01 ENCOUNTER — Ambulatory Visit (INDEPENDENT_AMBULATORY_CARE_PROVIDER_SITE_OTHER): Payer: Medicare Other | Admitting: Pulmonary Disease

## 2013-12-01 VITALS — BP 124/76 | HR 100 | Temp 97.2°F | Ht 71.0 in | Wt 206.8 lb

## 2013-12-01 DIAGNOSIS — G4733 Obstructive sleep apnea (adult) (pediatric): Secondary | ICD-10-CM

## 2013-12-01 NOTE — Progress Notes (Signed)
   Subjective:    Patient ID: Matthew Reed, male    DOB: 27-Aug-1975, 38 y.o.   MRN: [id number]  HPI The patient comes in today for follow-up of his obstructive sleep apnea. He has been compliant with his bilevel from his download, and has excellent control of his AHI. He is having no mask leaks, but he is having some pressure sores from the mask. He is working with this, and feels that it is fitting well at this time.  He feels fairly rested in the mornings upon arising, but is still having some fatigue during the day.   Review of Systems  Constitutional: Negative for fever and unexpected weight change.  HENT: Positive for congestion and postnasal drip. Negative for dental problem, ear pain, nosebleeds, rhinorrhea, sinus pressure, sneezing, sore throat and trouble swallowing.   Eyes: Negative for redness and itching.  Respiratory: Negative for cough, chest tightness, shortness of breath and wheezing.   Cardiovascular: Negative for palpitations and leg swelling.  Gastrointestinal: Negative for nausea and vomiting.  Genitourinary: Negative for dysuria.  Musculoskeletal: Negative for joint swelling.  Skin: Negative for rash.  Neurological: Negative for headaches.  Hematological: Does not bruise/bleed easily.  Psychiatric/Behavioral: Negative for dysphoric mood. The patient is not nervous/anxious.        Objective:   Physical Exam Well-developed male in no acute distress Nose without purulence or discharge noted No skin breakdown, but mild scarring over the bridge of the nose. Neck without lymphadenopathy or thyromegaly Lower extremities without edema, no cyanosis Alert and oriented, moves all 4 extremities.       Assessment & Plan:

## 2013-12-01 NOTE — Patient Instructions (Signed)
Continue on your bipap.  Your download looks good.  Keep working on weight loss followup with me again in one year.

## 2013-12-01 NOTE — Assessment & Plan Note (Signed)
The patient is doing very well with his bilevel device, and his download shows excellent control of his AHI with no significant leak. Despite this, the patient still has some fatigue during the day, but I do not think it is related to his sleep disordered breathing. I have asked him to keep working on modest weight loss, and to keep up with his mask changes and supplies. I will see him back in one year.

## 2014-03-12 ENCOUNTER — Telehealth: Payer: Self-pay | Admitting: Pulmonary Disease

## 2014-03-12 DIAGNOSIS — G4733 Obstructive sleep apnea (adult) (pediatric): Secondary | ICD-10-CM

## 2014-03-12 NOTE — Telephone Encounter (Signed)
[facility]  Bjorn LoserRhonda spoke with patient & advised him of this Lucilla Edinawne J Law

## 2014-03-12 NOTE — Telephone Encounter (Signed)
Pt is needing his CPAP supplies. [facility]. PCC's please fax over this order. Thanks.

## 2014-03-12 NOTE — Telephone Encounter (Signed)
Called and spoke to pt. Pt requesting new mask and hose. Order placed. Pt verbalized understanding and denied any further questions or concerns at this time.

## 2014-11-11 ENCOUNTER — Ambulatory Visit (INDEPENDENT_AMBULATORY_CARE_PROVIDER_SITE_OTHER): Payer: Medicare Other | Admitting: Pulmonary Disease

## 2014-11-11 ENCOUNTER — Encounter: Payer: Self-pay | Admitting: Pulmonary Disease

## 2014-11-11 VITALS — BP 124/82 | HR 100 | Temp 98.1°F | Ht 70.0 in | Wt 205.2 lb

## 2014-11-11 DIAGNOSIS — F17210 Nicotine dependence, cigarettes, uncomplicated: Secondary | ICD-10-CM | POA: Diagnosis not present

## 2014-11-11 DIAGNOSIS — G4733 Obstructive sleep apnea (adult) (pediatric): Secondary | ICD-10-CM

## 2014-11-11 DIAGNOSIS — Z720 Tobacco use: Secondary | ICD-10-CM | POA: Diagnosis not present

## 2014-11-11 NOTE — Progress Notes (Signed)
Chief Complaint  Patient presents with  . Follow-up    Former KC pt following for OSA: pt states he is doing well. pt using BIPAP every night for about 8 hours. mask and pressure good for pt. no concerns. DME: [facility]     History of Present Illness: Matthew JamesJason W Reed is a 39 y.o. male with OSA.  He was previously followed by Dr. Shelle Ironlance.  He has been using BiPAP.  His mask causes irritation over his nose.  He has not tried any other mask type.  He has full face mask.  His machine is more that 40 yrs old.  He does well with BiPAP otherwise, and this helps his sleep.  He continues to smoke cigarettes.  He has tried bupropion >> no help, chantix >> made him sick.  He has tried nicotine gum.   TESTS: PSG 10 >> AHI 49  PMhx >> Schizoaffective disorder  Past surgical hx, Medications, Allergies, Family hx, Social hx all reviewed.   Physical Exam: BP 124/82 mmHg  Pulse 100  Temp(Src) 98.1 F (36.7 C) (Oral)  Ht 5\' 10"  (1.778 m)  Wt 205 lb 3.2 oz (93.078 kg)  BMI 29.44 kg/m2  SpO2 99%  General - No distress ENT - No sinus tenderness, no oral exudate, no LAN, MP 3, +2 tonsils, irritation over bridge of nose Cardiac - s1s2 regular, no murmur Chest - No wheeze/rales/dullness Back - No focal tenderness Abd - Soft, non-tender Ext - No edema Neuro - Normal strength Skin - No rashes Psych - normal mood, and behavior   Assessment/Plan:  Obstructive sleep apnea. He is compliant with BiPAP and reports benefit. Plan: - will arrange for mask refit - will arrange for new BIPAP > his is more than 40 yrs old - will set up BiPAP 14/10 cm H2O  Tobacco abuse. Plan: - reviewed options to assist with smoking cessation - he will try nicotine gum   Coralyn HellingVineet Treon Kehl, MD [place] [facility]/Critical Care/Sleep Pager:  [phone]

## 2014-11-11 NOTE — Patient Instructions (Signed)
Will arrange for new BiPAP mask and BiPAP machine  Follow up in 1 year

## 2014-11-22 ENCOUNTER — Telehealth: Payer: Self-pay | Admitting: Pulmonary Disease

## 2014-11-22 NOTE — Telephone Encounter (Signed)
Spoke with pt. He reports [facility] advised him they never received his order. I called [facility] and was advised no order was received and to re fax this over. I have re faxed this over to them at [phone]. Pt is aware. Nothing further needed

## 2014-11-22 NOTE — Telephone Encounter (Signed)
[phone], pt cb

## 2014-11-22 NOTE — Telephone Encounter (Signed)
lmomtcb x1 

## 2014-12-06 ENCOUNTER — Ambulatory Visit: Payer: Medicare Other | Admitting: Pulmonary Disease

## 2015-11-15 ENCOUNTER — Ambulatory Visit (INDEPENDENT_AMBULATORY_CARE_PROVIDER_SITE_OTHER): Payer: Medicare Other | Admitting: Pulmonary Disease

## 2015-11-15 ENCOUNTER — Encounter: Payer: Self-pay | Admitting: Pulmonary Disease

## 2015-11-15 VITALS — BP 122/70 | HR 75 | Ht 71.0 in | Wt 210.8 lb

## 2015-11-15 DIAGNOSIS — Z720 Tobacco use: Secondary | ICD-10-CM | POA: Diagnosis not present

## 2015-11-15 DIAGNOSIS — G4733 Obstructive sleep apnea (adult) (pediatric): Secondary | ICD-10-CM | POA: Diagnosis not present

## 2015-11-15 NOTE — Progress Notes (Signed)
Current Outpatient Prescriptions on File Prior to Visit  Medication Sig  . cloZAPine (CLOZARIL) 100 MG tablet Take 1 tab in AM and 6 tabs at bedtime  . lithium carbonate (ESKALITH) 450 MG CR tablet Take 2.5 tablets by mouth daily.   Marland Kitchen. NUVIGIL 200 MG TABS Take 1 tablet by mouth every morning.  . Bisacodyl (LAXATIVE PO) Take 1 tablet by mouth daily. [facility]-- Herbal Laxative   No current facility-administered medications on file prior to visit.     Chief Complaint  Patient presents with  . Sleep Apnea    FOLLOW UP. pt states mask fits bad. pt states he gets 9 hrs of sleep, but feels sleepy during the day     Sleep tests PSG 10 >> AHI 49  Past medical history Schizoaffective disorder  Past surgical history, Family history, Social history, Allergies reviewed  Vital signs BP 122/70 (BP Location: Right Arm, Cuff Size: Normal)   Pulse 75   Ht 5\' 11"  (1.803 m)   Wt 210 lb 12.8 oz (95.6 kg)   SpO2 100%   BMI 29.40 kg/m   History of Present Illness: Matthew Reed is a 40 y.o. male with OSA.  He gets some leak from mask.  He has full face mask.  He wouldn't want to try chin strap.  He feels machine works well and helps his sleep.  He still gets a lull in the mid afternoon, but then is okay later in the evening until he goes to bed.  He is still smoking, but has cut down.  He has tried all types of therapies to help quit.  He feels much of his addiction is related to social situations he associates with smoking.  He feels nicotine gum worked well before.  Physical Exam:  General - No distress ENT - No sinus tenderness, no oral exudate, no LAN, MP 3, +2 tonsils Cardiac - s1s2 regular, no murmur Chest - No wheeze/rales/dullness Back - No focal tenderness Abd - Soft, non-tender Ext - No edema Neuro - Normal strength Skin - No rashes Psych - normal mood, and behavior   Assessment/Plan:  Obstructive sleep apnea. - He is compliant with BiPAP and  reports benefit - continue BiPAP 14/10 cm H2O  Tobacco abuse. - reviewed options to assist with smoking cessation   Patient Instructions  Call if you want to try changing your Bipap settings  Follow up in 1 year   Coralyn HellingVineet Markos Theil, MD  Pulmonary/Critical Care/Sleep Pager:  [phone] 11/15/2015, 10:36 AM

## 2015-11-15 NOTE — Patient Instructions (Signed)
Call if you want to try changing your Bipap settings  Follow up in 1 year

## 2016-01-30 DIAGNOSIS — F250 Schizoaffective disorder, bipolar type: Secondary | ICD-10-CM | POA: Diagnosis not present

## 2016-02-13 DIAGNOSIS — F259 Schizoaffective disorder, unspecified: Secondary | ICD-10-CM | POA: Diagnosis not present

## 2016-03-05 DIAGNOSIS — F200 Paranoid schizophrenia: Secondary | ICD-10-CM | POA: Diagnosis not present

## 2016-03-05 DIAGNOSIS — J111 Influenza due to unidentified influenza virus with other respiratory manifestations: Secondary | ICD-10-CM | POA: Diagnosis not present

## 2016-03-05 DIAGNOSIS — Z720 Tobacco use: Secondary | ICD-10-CM | POA: Diagnosis not present

## 2016-03-05 DIAGNOSIS — R05 Cough: Secondary | ICD-10-CM | POA: Diagnosis not present

## 2016-03-05 DIAGNOSIS — Z2089 Contact with and (suspected) exposure to other communicable diseases: Secondary | ICD-10-CM | POA: Diagnosis not present

## 2016-03-14 DIAGNOSIS — Z79899 Other long term (current) drug therapy: Secondary | ICD-10-CM | POA: Diagnosis not present

## 2016-03-14 DIAGNOSIS — F259 Schizoaffective disorder, unspecified: Secondary | ICD-10-CM | POA: Diagnosis not present

## 2016-03-30 DIAGNOSIS — R631 Polydipsia: Secondary | ICD-10-CM | POA: Diagnosis not present

## 2016-03-30 DIAGNOSIS — Q181 Preauricular sinus and cyst: Secondary | ICD-10-CM | POA: Diagnosis not present

## 2016-03-30 DIAGNOSIS — R350 Frequency of micturition: Secondary | ICD-10-CM | POA: Diagnosis not present

## 2016-04-02 DIAGNOSIS — F250 Schizoaffective disorder, bipolar type: Secondary | ICD-10-CM | POA: Diagnosis not present

## 2016-04-13 DIAGNOSIS — F259 Schizoaffective disorder, unspecified: Secondary | ICD-10-CM | POA: Diagnosis not present

## 2016-04-13 DIAGNOSIS — Z79899 Other long term (current) drug therapy: Secondary | ICD-10-CM | POA: Diagnosis not present

## 2016-05-14 DIAGNOSIS — F259 Schizoaffective disorder, unspecified: Secondary | ICD-10-CM | POA: Diagnosis not present

## 2016-05-14 DIAGNOSIS — Z79899 Other long term (current) drug therapy: Secondary | ICD-10-CM | POA: Diagnosis not present

## 2016-05-28 DIAGNOSIS — F250 Schizoaffective disorder, bipolar type: Secondary | ICD-10-CM | POA: Diagnosis not present

## 2016-06-13 DIAGNOSIS — Z79899 Other long term (current) drug therapy: Secondary | ICD-10-CM | POA: Diagnosis not present

## 2016-06-13 DIAGNOSIS — F259 Schizoaffective disorder, unspecified: Secondary | ICD-10-CM | POA: Diagnosis not present

## 2016-07-16 DIAGNOSIS — Z79899 Other long term (current) drug therapy: Secondary | ICD-10-CM | POA: Diagnosis not present

## 2016-07-16 DIAGNOSIS — F259 Schizoaffective disorder, unspecified: Secondary | ICD-10-CM | POA: Diagnosis not present

## 2016-07-23 DIAGNOSIS — F250 Schizoaffective disorder, bipolar type: Secondary | ICD-10-CM | POA: Diagnosis not present

## 2016-08-09 DIAGNOSIS — F250 Schizoaffective disorder, bipolar type: Secondary | ICD-10-CM | POA: Diagnosis not present

## 2016-08-13 DIAGNOSIS — F259 Schizoaffective disorder, unspecified: Secondary | ICD-10-CM | POA: Diagnosis not present

## 2016-08-13 DIAGNOSIS — Z79899 Other long term (current) drug therapy: Secondary | ICD-10-CM | POA: Diagnosis not present

## 2016-09-14 DIAGNOSIS — Z951 Presence of aortocoronary bypass graft: Secondary | ICD-10-CM | POA: Diagnosis not present

## 2016-09-14 DIAGNOSIS — F259 Schizoaffective disorder, unspecified: Secondary | ICD-10-CM | POA: Diagnosis not present

## 2016-09-21 DIAGNOSIS — F250 Schizoaffective disorder, bipolar type: Secondary | ICD-10-CM | POA: Diagnosis not present

## 2016-10-12 DIAGNOSIS — F259 Schizoaffective disorder, unspecified: Secondary | ICD-10-CM | POA: Diagnosis not present

## 2016-11-08 DIAGNOSIS — F250 Schizoaffective disorder, bipolar type: Secondary | ICD-10-CM | POA: Diagnosis not present

## 2016-11-13 DIAGNOSIS — F259 Schizoaffective disorder, unspecified: Secondary | ICD-10-CM | POA: Diagnosis not present

## 2016-12-11 DIAGNOSIS — Z79899 Other long term (current) drug therapy: Secondary | ICD-10-CM | POA: Diagnosis not present

## 2016-12-11 DIAGNOSIS — F259 Schizoaffective disorder, unspecified: Secondary | ICD-10-CM | POA: Diagnosis not present

## 2017-01-14 DIAGNOSIS — F259 Schizoaffective disorder, unspecified: Secondary | ICD-10-CM | POA: Diagnosis not present

## 2017-01-14 DIAGNOSIS — Z79899 Other long term (current) drug therapy: Secondary | ICD-10-CM | POA: Diagnosis not present

## 2017-02-08 DIAGNOSIS — Z79899 Other long term (current) drug therapy: Secondary | ICD-10-CM | POA: Diagnosis not present

## 2017-02-08 DIAGNOSIS — F259 Schizoaffective disorder, unspecified: Secondary | ICD-10-CM | POA: Diagnosis not present

## 2017-02-19 DIAGNOSIS — F250 Schizoaffective disorder, bipolar type: Secondary | ICD-10-CM | POA: Diagnosis not present

## 2017-03-08 DIAGNOSIS — F259 Schizoaffective disorder, unspecified: Secondary | ICD-10-CM | POA: Diagnosis not present

## 2017-03-08 DIAGNOSIS — Z79899 Other long term (current) drug therapy: Secondary | ICD-10-CM | POA: Diagnosis not present

## 2017-04-05 DIAGNOSIS — F259 Schizoaffective disorder, unspecified: Secondary | ICD-10-CM | POA: Diagnosis not present

## 2017-04-05 DIAGNOSIS — Z79899 Other long term (current) drug therapy: Secondary | ICD-10-CM | POA: Diagnosis not present

## 2017-04-16 DIAGNOSIS — F250 Schizoaffective disorder, bipolar type: Secondary | ICD-10-CM | POA: Diagnosis not present

## 2017-05-07 DIAGNOSIS — F259 Schizoaffective disorder, unspecified: Secondary | ICD-10-CM | POA: Diagnosis not present

## 2017-05-07 DIAGNOSIS — Z79899 Other long term (current) drug therapy: Secondary | ICD-10-CM | POA: Diagnosis not present

## 2017-06-03 DIAGNOSIS — F259 Schizoaffective disorder, unspecified: Secondary | ICD-10-CM | POA: Diagnosis not present

## 2017-06-03 DIAGNOSIS — Z79899 Other long term (current) drug therapy: Secondary | ICD-10-CM | POA: Diagnosis not present

## 2017-06-11 DIAGNOSIS — F250 Schizoaffective disorder, bipolar type: Secondary | ICD-10-CM | POA: Diagnosis not present

## 2017-07-05 DIAGNOSIS — Z79899 Other long term (current) drug therapy: Secondary | ICD-10-CM | POA: Diagnosis not present

## 2017-07-05 DIAGNOSIS — F259 Schizoaffective disorder, unspecified: Secondary | ICD-10-CM | POA: Diagnosis not present

## 2017-08-02 DIAGNOSIS — F529 Unspecified sexual dysfunction not due to a substance or known physiological condition: Secondary | ICD-10-CM | POA: Diagnosis not present

## 2017-08-02 DIAGNOSIS — Z79899 Other long term (current) drug therapy: Secondary | ICD-10-CM | POA: Diagnosis not present

## 2017-09-02 DIAGNOSIS — F250 Schizoaffective disorder, bipolar type: Secondary | ICD-10-CM | POA: Diagnosis not present

## 2017-09-03 DIAGNOSIS — F259 Schizoaffective disorder, unspecified: Secondary | ICD-10-CM | POA: Diagnosis not present

## 2017-09-30 ENCOUNTER — Telehealth: Payer: Self-pay | Admitting: Pulmonary Disease

## 2017-09-30 DIAGNOSIS — Z79899 Other long term (current) drug therapy: Secondary | ICD-10-CM | POA: Diagnosis not present

## 2017-09-30 DIAGNOSIS — F259 Schizoaffective disorder, unspecified: Secondary | ICD-10-CM | POA: Diagnosis not present

## 2017-09-30 NOTE — Telephone Encounter (Signed)
Pt's wife Marina Gravelngelia is calling back   [phone]

## 2017-09-30 NOTE — Telephone Encounter (Signed)
Spoke with wife Edson Snowballngelina, informed Edson Snowballngelina that patient was last seen in 2017 and will need a office visit. Appointment made for 10/01/2017 with NP Clent RidgesWalsh. Nothing further needed at this time.

## 2017-09-30 NOTE — Telephone Encounter (Signed)
ATC pt- unable to leave vm, as mailbox has not been setup. Will call back 

## 2017-10-01 ENCOUNTER — Ambulatory Visit: Payer: Medicare Other | Admitting: Primary Care

## 2017-10-01 NOTE — Progress Notes (Deleted)
@  Patient ID: Matthew Reed, male    DOB: October 05, 1975, 42 y.o.   MRN: [id number]  No chief complaint on file.   Referring provider: No ref. provider found  HPI: 42 year old male, current everyday smoker. PMH obstructive sleep apnea, schizoaffective disorder. Patient of Dr. Halford Chessman, last seen on 11/15/2015. PSG 10, AHI 49. Patient is maintained on BiPAP 14/10cm H20.   10/01/2017 Patient presents today needing CPAP OV and supplies.     Not on File   There is no immunization history on file for this patient.  Past Medical History:  Diagnosis Date  . Schizoaffective disorder ([place])     Tobacco History: Social History   Tobacco Use  Smoking Status Current Every Day Smoker  . Packs/day: 1.00  . Years: 25.00  . Pack years: 25.00  . Types: Cigarettes  Smokeless Tobacco Never Used   Ready to quit: Not Answered Counseling given: Not Answered   Outpatient Medications Prior to Visit  Medication Sig Dispense Refill  . Bisacodyl (LAXATIVE PO) Take 1 tablet by mouth daily. [employer]-- [employer]-- Herbal Laxative    . cloZAPine (CLOZARIL) 100 MG tablet Take 1 tab in AM and 6 tabs at bedtime    . lithium carbonate (ESKALITH) 450 MG CR tablet Take 2.5 tablets by mouth daily.     Marland Kitchen NUVIGIL 200 MG TABS Take 1 tablet by mouth every morning.  0   No facility-administered medications prior to visit.       Review of Systems  Review of Systems   Physical Exam  There were no vitals taken for this visit. Physical Exam   Lab Results:  CBC    Component Value Date/Time   WBC 4.7 03/09/2007 2256   RBC 5.06 03/09/2007 2256   HGB 16.1 03/09/2007 2256   HCT 45.7 03/09/2007 2256   PLT 164 03/09/2007 2256   MCV 90.2 03/09/2007 2256   MCHC 35.3 03/09/2007 2256   RDW 12.6 03/09/2007 2256   LYMPHSABS 2.1 03/09/2007 2256   MONOABS 0.4 03/09/2007 2256   EOSABS 0.0 03/09/2007 2256   BASOSABS 0.1 03/09/2007 2256    BMET    Component Value Date/Time   NA 141  03/09/2007 2256   K 4.1 03/09/2007 2256   CL 105 03/09/2007 2256   CO2 28 03/09/2007 2256   GLUCOSE 107 (H) 03/09/2007 2256   BUN 7 03/09/2007 2256   CREATININE 0.94 03/09/2007 2256   CALCIUM 9.5 03/09/2007 2256   GFRNONAA >60 03/09/2007 2256   GFRAA  03/09/2007 2256    >60        The eGFR has been calculated using the MDRD equation. This calculation has not been validated in all clinical    BNP No results found for: BNP  ProBNP No results found for: PROBNP  Imaging: No results found.   Assessment & Plan:   No problem-specific Assessment & Plan notes found for this encounter.     Martyn Ehrich, NP 10/01/2017

## 2017-10-22 ENCOUNTER — Ambulatory Visit (INDEPENDENT_AMBULATORY_CARE_PROVIDER_SITE_OTHER): Payer: Medicare Other | Admitting: Pulmonary Disease

## 2017-10-22 ENCOUNTER — Encounter: Payer: Self-pay | Admitting: Pulmonary Disease

## 2017-10-22 VITALS — BP 118/78 | HR 128 | Ht 67.0 in | Wt 207.0 lb

## 2017-10-22 DIAGNOSIS — Z720 Tobacco use: Secondary | ICD-10-CM | POA: Diagnosis not present

## 2017-10-22 DIAGNOSIS — G4733 Obstructive sleep apnea (adult) (pediatric): Secondary | ICD-10-CM | POA: Diagnosis not present

## 2017-10-22 NOTE — Patient Instructions (Signed)
Will arrange for new Bipap mask and supplies  Follow up in 1 year 

## 2017-10-22 NOTE — Progress Notes (Signed)
[facility]  Chief Complaint  Patient presents with  . Follow-up    Pt is doing well overall with bipap machine. Pt is needing new supplies and mask.     Constitutional:  BP 118/78 (BP Location: Left Arm, Cuff Size: Normal)   Pulse (!) 128   Ht 5\' 7"  (1.702 m)   Wt 207 lb (93.9 kg)   SpO2 98%   BMI 32.42 kg/m   Past Medical History:  Schizoaffective disorder  History of Present Illness: Matthew Reed is a 42 y.o. male with obstructive sleep apnea.  He uses Bipap nightly . Needs new supplies.  Not having sinus congestion, sore throat, dry mouth, or aerophagia.  Still smokes.  Tried chantix, wellbutrin, nicotine patch years ago.  These helped some, but then starting smoking again.   Respiratory Exam: Appearance - well kempt  ENMT - clear nasal mucosa, midline nasal septum, no oral exudates, no LAN, trachea midline Respiratory - normal chest wall, normal respiratory effort, no accessory muscle use, no wheeze/rales CV - s1s2 regular rate and rhythm, no murmurs, no peripheral edema, radial pulses symmetric GI - soft, non tender, no masses Lymph - no adenopathy noted in neck and axillary areas MSK - normal muscle strength and tone, normal gait Ext - no cyanosis, clubbing, or joint inflammation noted Skin - no rashes, lesions, or ulcers Neuro - oriented to person, place, and time Psych - normal mood and affect  Assessment/Plan:  Obstructive sleep apnea. - he is compliant with Bipap and reports benefit - continue Bipap 14/10 cm H2O - will arrange for new supplies  Tobacco abuse. - discussed options to quit - he will check with his PCP about whether he can try wellbutrin again   Patient Instructions  Will arrange for new Bipap mask and supplies  Follow up in 1 year    Coralyn Helling, MD [facility] 10/22/2017, 11:31 AM  Flow Sheet  Sleep tests: PSG 2010 >> AHI 49 Bipap 09/20/17 to 10/19/17 >> used on 30  of 30 nights with average 10 hrs 31 min per night.  Average AHI 0.3 with Bipap 14/10 cm H2O.  Medications: Allergies as of 10/22/2017   Not on File     Medication List        Accurate as of 10/22/17 11:31 AM. Always use your most recent med list.          cloZAPine 100 MG tablet Commonly known as:  CLOZARIL Take 1 tab in AM and 6 tabs at bedtime   lithium carbonate 450 MG CR tablet Commonly known as:  ESKALITH Take 2.5 tablets by mouth daily.   NUVIGIL 200 MG Tabs Generic drug:  Armodafinil Take 1 tablet by mouth every morning.   propranolol 20 MG tablet Commonly known as:  INDERAL Take 20 mg by mouth 3 (three) times daily.   rivastigmine 4.5 MG capsule Commonly known as:  EXELON Take 4.5 mg by mouth 2 (two) times daily.       Past Surgical History: He  has a past surgical history that includes No past surgeries.  Family History: His family history includes Diabetes in his mother; Hyperlipidemia in his mother; Hypertension in his father and mother; Kidney cancer in his mother.  Social History: He  reports that he has been smoking cigarettes. He has a 25.00 pack-year smoking history. He has never used smokeless tobacco. He reports that he does not drink alcohol or use drugs.

## 2017-10-31 DIAGNOSIS — Z79899 Other long term (current) drug therapy: Secondary | ICD-10-CM | POA: Diagnosis not present

## 2017-10-31 DIAGNOSIS — F259 Schizoaffective disorder, unspecified: Secondary | ICD-10-CM | POA: Diagnosis not present

## 2017-11-25 ENCOUNTER — Ambulatory Visit (INDEPENDENT_AMBULATORY_CARE_PROVIDER_SITE_OTHER): Payer: Medicare Other | Admitting: Psychiatry

## 2017-11-25 ENCOUNTER — Encounter: Payer: Self-pay | Admitting: Psychiatry

## 2017-11-25 DIAGNOSIS — F422 Mixed obsessional thoughts and acts: Secondary | ICD-10-CM | POA: Diagnosis not present

## 2017-11-25 DIAGNOSIS — F17203 Nicotine dependence unspecified, with withdrawal: Secondary | ICD-10-CM

## 2017-11-25 DIAGNOSIS — G3184 Mild cognitive impairment, so stated: Secondary | ICD-10-CM | POA: Diagnosis not present

## 2017-11-25 DIAGNOSIS — F250 Schizoaffective disorder, bipolar type: Secondary | ICD-10-CM | POA: Diagnosis not present

## 2017-11-25 MED ORDER — RIVASTIGMINE TARTRATE 3 MG PO CAPS: ORAL_CAPSULE | ORAL | 5 refills | Status: DC

## 2017-11-25 NOTE — Progress Notes (Signed)
Matthew Reed [id number] 15-Jun-1975 42 y.o.  Subjective:   Patient ID:  Matthew Reed is a 42 y.o. (DOB Oct 31, 1975) male.  Chief Complaint:  Chief Complaint  Patient presents with  . Anxiety  . Medication Problem    tremor  . Memory Loss  . Nicotine Dependence    1 1/2 ppd    HPI Matthew Reed presents to the office today for follow-up of schizoaffective disorder and anxiety and memory loss. Recently addedExelon and has seen benefit in every day matters. Remembers Bible study notes better and to put things up.  But wife still notices memory problems. Overall doing well.  Went to [employer] and was quiet and withdrawn but not paranoia.   Wonders about Wellbutrin for smoking but wife does not want him to take more meds.  Failed to tolerate Chantix DT nausea.  Took Wellbutrin years ago.  Can't stop smoking.  Review of Systems:  Review of Systems  Medications: I have reviewed the patient's current medications.  Current Outpatient Medications  Medication Sig Dispense Refill  . cloZAPine (CLOZARIL) 100 MG tablet Take 700 mg by mouth at bedtime.     Marland Kitchen lithium carbonate (ESKALITH) 450 MG CR tablet Take 1,350 tablets by mouth daily. Take 1 tab (450 mg) qam & 1 1/2 tab at night    . modafinil (PROVIGIL) 200 MG tablet Take 200 mg by mouth every morning.    . propranolol (INDERAL) 20 MG tablet Take 60 mg by mouth 2 (two) times daily. 1-3 tabs bid    . rivastigmine (EXELON) 3 MG capsule Take 3 mg by mouth every morning.     . rivastigmine (EXELON) 6 MG capsule Take 6 mg by mouth at bedtime.     No current facility-administered medications for this visit.     Medication Side Effects: None  Allergies: No Known Allergies  Past Medical History:  Diagnosis Date  . Schizoaffective disorder ([place])     Family History  Problem Relation Age of Onset  . Kidney cancer Mother   . Diabetes Mother   . Hypertension Mother   . Hyperlipidemia Mother   . Hypertension Father     Social  History   Socioeconomic History  . Marital status: Married    Spouse name: Not on file  . Number of children: Not on file  . Years of education: Not on file  . Highest education level: Not on file  Occupational History  . 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  Social Needs  . Financial resource strain: Not on file  . Food insecurity:    Worry: Not on file    Inability: Not on file  . Transportation needs:    Medical: Not on file    Non-medical: Not on file  Tobacco Use  . Smoking status: Current Every Day Smoker    Packs/day: 1.00    Years: 25.00    Pack years: 25.00    Types: Cigarettes  . Smokeless tobacco: Never Used  Substance and Sexual Activity  . Alcohol use: No  . Drug use: No  . Sexual activity: Not on file  Lifestyle  . Physical activity:    Days per week: Not on file    Minutes per session: Not on file  . Stress: Not on file  Relationships  . Social connections:    Talks on phone: Not on file    Gets together: Not on file    Attends religious service: Not on  file    Active member of club or organization: Not on file    Attends meetings of clubs or organizations: Not on file    Relationship status: Not on file  . Intimate partner violence:    Fear of current or ex partner: Not on file    Emotionally abused: Not on file    Physically abused: Not on file    Forced sexual activity: Not on file  Other Topics Concern  . Not on file  Social History Narrative  . Not on file    Past Medical History, Surgical history, Social history, and Family history were reviewed and updated as appropriate.   Please see review of systems for further details on the patient's review from today.   Objective:   Physical Exam:  There were no vitals taken for this visit.  Physical Exam  Lab Review:     Component Value Date/Time   NA 141 03/09/2007 2256   K 4.1 03/09/2007 2256   CL 105 03/09/2007 2256   CO2 28 03/09/2007 2256   GLUCOSE 107 (H)  03/09/2007 2256   BUN 7 03/09/2007 2256   CREATININE 0.94 03/09/2007 2256   CALCIUM 9.5 03/09/2007 2256   GFRNONAA >60 03/09/2007 2256   GFRAA  03/09/2007 2256    >60        The eGFR has been calculated using the MDRD equation. This calculation has not been validated in all clinical       Component Value Date/Time   WBC 4.7 03/09/2007 2256   RBC 5.06 03/09/2007 2256   HGB 16.1 03/09/2007 2256   HCT 45.7 03/09/2007 2256   PLT 164 03/09/2007 2256   MCV 90.2 03/09/2007 2256   MCHC 35.3 03/09/2007 2256   RDW 12.6 03/09/2007 2256   LYMPHSABS 2.1 03/09/2007 2256   MONOABS 0.4 03/09/2007 2256   EOSABS 0.0 03/09/2007 2256   BASOSABS 0.1 03/09/2007 2256    No results found for: POCLITH, LITHIUM   Lab Results  Component Value Date   VALPROATE 78.4 03/15/2007     .res Assessment: Plan:    Schizoaffective disorder, bipolar type ([place])  Mild cognitive impairment  Mixed obsessional thoughts and acts  Nicotine dependence with withdrawal, unspecified nicotine product type  Please see After Visit Summary for patient specific instructions.  Greater than 50% of face to face time with patient was spent on counseling and coordination of care. We discussed several diagnoses.  Matthew Reed has a long history of bipolar type schizoaffective disorder with severe mania and paranoia and several psych hospitalizations.  He is failed multiple medications but has had a very good response to clozapine.  Initially had a lot of sedation on it and requires the modafinil but the sedative side effects have improved over the course of years.  Discussed potential metabolic side effects associated with atypical antipsychotics, as well as potential risk for movement side effects. Advised pt to contact office if movement side effects occur.  We specifically discussed the risk of aplastic anemia from the clozapine.  His labs have been normal.  He gets monthly CBCs  Smoking cessation and options discussed  including Wellbutrin and some risk for anxiety and mania.  He'll discuss with W and call if he wants to pursue. Then we'll start 178m/D and increase to 3024mD.   Disc MCI.  He sees benefit from Exelon but can't tolerate 45m69mID.  This appointment was 30 minutes  Follow-up 3 months.  He may call if he wants to pursue  the Wellbutrin for smoking cessation since he cannot tolerate Chantix.  Lynder Parents MD, DFAPA   No future appointments.  No orders of the defined types were placed in this encounter.     -------------------------------

## 2017-11-28 DIAGNOSIS — F259 Schizoaffective disorder, unspecified: Secondary | ICD-10-CM | POA: Diagnosis not present

## 2017-11-28 DIAGNOSIS — Z79899 Other long term (current) drug therapy: Secondary | ICD-10-CM | POA: Diagnosis not present

## 2017-12-03 ENCOUNTER — Other Ambulatory Visit: Payer: Self-pay

## 2017-12-03 DIAGNOSIS — G3184 Mild cognitive impairment, so stated: Secondary | ICD-10-CM

## 2017-12-03 MED ORDER — RIVASTIGMINE TARTRATE 3 MG PO CAPS
ORAL_CAPSULE | ORAL | 5 refills | Status: DC
Start: 2017-12-03 — End: 2017-12-06

## 2017-12-06 ENCOUNTER — Other Ambulatory Visit: Payer: Self-pay

## 2017-12-06 MED ORDER — RIVASTIGMINE TARTRATE 4.5 MG PO CAPS: 4.5000 mg | ORAL_CAPSULE | Freq: Two times a day (BID) | ORAL | 3 refills | Status: DC

## 2017-12-19 ENCOUNTER — Other Ambulatory Visit: Payer: Self-pay

## 2017-12-19 MED ORDER — MODAFINIL 200 MG PO TABS: 200.0000 mg | ORAL_TABLET | ORAL | 2 refills | Status: DC

## 2018-01-02 DIAGNOSIS — Z79899 Other long term (current) drug therapy: Secondary | ICD-10-CM | POA: Diagnosis not present

## 2018-01-02 DIAGNOSIS — F259 Schizoaffective disorder, unspecified: Secondary | ICD-10-CM | POA: Diagnosis not present

## 2018-01-31 DIAGNOSIS — Z79899 Other long term (current) drug therapy: Secondary | ICD-10-CM | POA: Diagnosis not present

## 2018-01-31 DIAGNOSIS — F259 Schizoaffective disorder, unspecified: Secondary | ICD-10-CM | POA: Diagnosis not present

## 2018-02-04 ENCOUNTER — Other Ambulatory Visit: Payer: Self-pay

## 2018-02-04 MED ORDER — PROPRANOLOL HCL 20 MG PO TABS
ORAL_TABLET | ORAL | 0 refills | Status: DC
Start: 2018-02-04 — End: 2018-05-04

## 2018-02-05 ENCOUNTER — Other Ambulatory Visit: Payer: Self-pay

## 2018-02-05 MED ORDER — CLOZAPINE 100 MG PO TABS
700.0000 mg | ORAL_TABLET | Freq: Every day | ORAL | 3 refills | Status: DC
Start: 2018-02-05 — End: 2018-06-03

## 2018-02-10 ENCOUNTER — Ambulatory Visit (INDEPENDENT_AMBULATORY_CARE_PROVIDER_SITE_OTHER): Payer: Medicare Other | Admitting: Psychiatry

## 2018-02-10 ENCOUNTER — Encounter: Payer: Self-pay | Admitting: Psychiatry

## 2018-02-10 VITALS — BP 116/74 | HR 75

## 2018-02-10 DIAGNOSIS — F17203 Nicotine dependence unspecified, with withdrawal: Secondary | ICD-10-CM

## 2018-02-10 DIAGNOSIS — F422 Mixed obsessional thoughts and acts: Secondary | ICD-10-CM

## 2018-02-10 DIAGNOSIS — G3184 Mild cognitive impairment, so stated: Secondary | ICD-10-CM

## 2018-02-10 DIAGNOSIS — F250 Schizoaffective disorder, bipolar type: Secondary | ICD-10-CM

## 2018-02-10 MED ORDER — VARENICLINE TARTRATE 1 MG PO TABS: 1.0000 mg | ORAL_TABLET | Freq: Two times a day (BID) | ORAL | 3 refills | Status: DC

## 2018-02-10 NOTE — Progress Notes (Signed)
NOBLE CICALESE [id number] 09-Jul-1975 43 y.o.  Subjective:   Patient ID:  Matthew Reed is a 43 y.o. (DOB Jul 25, 1975) male.  Chief Complaint:  Chief Complaint  Patient presents with  . Follow-up    Medication Mangement  . Nicotine Dependence    HPI last seen November 25, 2017 Matthew Reed presents to the office today for follow-up of smoking and schizoaffective disorder, MCI and Se meds.  At his last visit we increased Wellbutrin to help with smoking cessation.  If that failed to work then we will going to try Chantix.  At his last visit his schizoaffective disorder symptoms were under good control and the sleepiness was managed with modafinil.  Never did start Wellbutrin but wife didn't think it was a good idea and she was worried about psych SE.  So he never took it.  Doesn't think he can handle the nausea from Chantix.  Memory not normal but meds help.  Fearfulness, paranoia all good.  More comfortable standing up at church.  Can speak in public now.  Change in clozapine pills generic worried him but works fine.    Review of Systems:  Review of Systems  Neurological: Negative for tremors and weakness.  Psychiatric/Behavioral: Negative for agitation, behavioral problems, confusion, decreased concentration, dysphoric mood, hallucinations, self-injury, sleep disturbance and suicidal ideas. The patient is not nervous/anxious and is not hyperactive.   Not usually short of breath.  Medications: I have reviewed the patient's current medications.  Current Outpatient Medications  Medication Sig Dispense Refill  . cloZAPine (CLOZARIL) 100 MG tablet Take 7 tablets (700 mg total) by mouth at bedtime. 210 tablet 3  . lithium carbonate (ESKALITH) 450 MG CR tablet Take 1,350 tablets by mouth daily. Take 1 tab (450 mg) qam & 1 1/2 tab at night    . modafinil (PROVIGIL) 200 MG tablet Take 1 tablet (200 mg total) by mouth every morning. 30 tablet 2  . propranolol (INDERAL) 20 MG tablet  Take 1 to 3 tablets by mouth twice a day (Patient taking differently: 60 mg 2 (two) times daily. ) 540 tablet 0  . rivastigmine (EXELON) 4.5 MG capsule Take 1 capsule (4.5 mg total) by mouth 2 (two) times daily. 60 capsule 3  . varenicline (CHANTIX CONTINUING MONTH PAK) 1 MG tablet Take 1 tablet (1 mg total) by mouth 2 (two) times daily. 60 tablet 3   No current facility-administered medications for this visit.     Medication Side Effects: None  Allergies: No Known Allergies  Past Medical History:  Diagnosis Date  . Schizoaffective disorder ([place])     Family History  Problem Relation Age of Onset  . Kidney cancer Mother   . Diabetes Mother   . Hypertension Mother   . Hyperlipidemia Mother   . Hypertension Father     Social History   Socioeconomic History  . Marital status: Married    Spouse name: Not on file  . Number of children: Not on file  . Years of education: Not on file  . Highest education level: Not on file  Occupational History  . 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  Social Needs  . Financial resource strain: Not on file  . Food insecurity:    Worry: Not on file    Inability: Not on file  . Transportation needs:    Medical: Not on file    Non-medical: Not on file  Tobacco Use  . Smoking status: Current Every Day Smoker  Packs/day: 1.00    Years: 25.00    Pack years: 25.00    Types: Cigarettes  . Smokeless tobacco: Never Used  Substance and Sexual Activity  . Alcohol use: No  . Drug use: No  . Sexual activity: Not on file  Lifestyle  . Physical activity:    Days per week: Not on file    Minutes per session: Not on file  . Stress: Not on file  Relationships  . Social connections:    Talks on phone: Not on file    Gets together: Not on file    Attends religious service: Not on file    Active member of club or organization: Not on file    Attends meetings of clubs or organizations: Not on file    Relationship status: Not on  file  . Intimate partner violence:    Fear of current or ex partner: Not on file    Emotionally abused: Not on file    Physically abused: Not on file    Forced sexual activity: Not on file  Other Topics Concern  . Not on file  Social History Narrative  . Not on file    Past Medical History, Surgical history, Social history, and Family history were reviewed and updated as appropriate.   Please see review of systems for further details on the patient's review from today.   Objective:   Physical Exam:  BP 116/74 (BP Location: Left Arm)   Pulse 75   Physical Exam Constitutional:      General: He is not in acute distress.    Appearance: He is well-developed.  Musculoskeletal:        General: No deformity.  Neurological:     Mental Status: He is alert and oriented to person, place, and time.     Motor: No tremor.     Coordination: Coordination normal.     Gait: Gait normal.  Psychiatric:        Attention and Perception: Attention and perception normal. He is attentive.        Mood and Affect: Mood normal. Mood is not anxious or depressed. Affect is not labile, blunt, angry or inappropriate.        Speech: Speech normal.        Behavior: Behavior normal.        Thought Content: Thought content normal. Thought content does not include homicidal or suicidal ideation. Thought content does not include homicidal or suicidal plan.        Cognition and Memory: Cognition normal.        Judgment: Judgment normal.     Comments: Insight is good.     Lab Review:     Component Value Date/Time   NA 141 03/09/2007 2256   K 4.1 03/09/2007 2256   CL 105 03/09/2007 2256   CO2 28 03/09/2007 2256   GLUCOSE 107 (H) 03/09/2007 2256   BUN 7 03/09/2007 2256   CREATININE 0.94 03/09/2007 2256   CALCIUM 9.5 03/09/2007 2256   GFRNONAA >60 03/09/2007 2256   GFRAA  03/09/2007 2256    >60        The eGFR has been calculated using the MDRD equation. This calculation has not been validated in  all clinical       Component Value Date/Time   WBC 4.7 03/09/2007 2256   RBC 5.06 03/09/2007 2256   HGB 16.1 03/09/2007 2256   HCT 45.7 03/09/2007 2256   PLT 164 03/09/2007 2256   MCV  90.2 03/09/2007 2256   MCHC 35.3 03/09/2007 2256   RDW 12.6 03/09/2007 2256   LYMPHSABS 2.1 03/09/2007 2256   MONOABS 0.4 03/09/2007 2256   EOSABS 0.0 03/09/2007 2256   BASOSABS 0.1 03/09/2007 2256    No results found for: POCLITH, LITHIUM   Lab Results  Component Value Date   VALPROATE 78.4 03/15/2007     .res Assessment: Plan:    Schizoaffective disorder, bipolar type ([place])  Mixed obsessional thoughts and acts  Nicotine dependence with withdrawal, unspecified nicotine product type  Mild cognitive impairment   Option lower dose Chantix.  Option still try Wellbutrin if needed.  He agrees to Chantix and stay at half dose until he tolerates it.  He is having good response to the clozapine and his blood levels have been good.  In fact the clozapine has been miraculous and resolving his treatment resistant paranoia.  He is not having significant mood swings on the mood stabilizers.  He is tolerating the current medications.  Follow-up 3 months  Lynder Parents, MD, DFAPA  Please see After Visit Summary for patient specific instructions.  Future Appointments  Date Time Provider [place]  02/27/2018  1:00 PM Cottle, Billey Co., MD [facility] None    No orders of the defined types were placed in this encounter.     -------------------------------

## 2018-02-17 ENCOUNTER — Other Ambulatory Visit: Payer: Self-pay

## 2018-02-17 MED ORDER — LITHIUM CARBONATE ER 450 MG PO TBCR: EXTENDED_RELEASE_TABLET | ORAL | 2 refills | Status: DC

## 2018-02-27 ENCOUNTER — Ambulatory Visit: Payer: Medicare Other | Admitting: Psychiatry

## 2018-03-04 DIAGNOSIS — F259 Schizoaffective disorder, unspecified: Secondary | ICD-10-CM | POA: Diagnosis not present

## 2018-03-04 DIAGNOSIS — Z79899 Other long term (current) drug therapy: Secondary | ICD-10-CM | POA: Diagnosis not present

## 2018-03-21 ENCOUNTER — Other Ambulatory Visit: Payer: Self-pay

## 2018-03-21 MED ORDER — MODAFINIL 200 MG PO TABS: 200.0000 mg | ORAL_TABLET | ORAL | 2 refills | Status: DC

## 2018-04-07 DIAGNOSIS — F259 Schizoaffective disorder, unspecified: Secondary | ICD-10-CM | POA: Diagnosis not present

## 2018-04-07 DIAGNOSIS — Z79899 Other long term (current) drug therapy: Secondary | ICD-10-CM | POA: Diagnosis not present

## 2018-05-03 ENCOUNTER — Other Ambulatory Visit: Payer: Self-pay | Admitting: Psychiatry

## 2018-05-06 DIAGNOSIS — F259 Schizoaffective disorder, unspecified: Secondary | ICD-10-CM | POA: Diagnosis not present

## 2018-05-06 DIAGNOSIS — Z79899 Other long term (current) drug therapy: Secondary | ICD-10-CM | POA: Diagnosis not present

## 2018-05-07 ENCOUNTER — Encounter: Payer: Self-pay | Admitting: Psychiatry

## 2018-05-12 ENCOUNTER — Ambulatory Visit (INDEPENDENT_AMBULATORY_CARE_PROVIDER_SITE_OTHER): Payer: Medicare Other | Admitting: Psychiatry

## 2018-05-12 ENCOUNTER — Other Ambulatory Visit: Payer: Self-pay

## 2018-05-12 ENCOUNTER — Encounter: Payer: Self-pay | Admitting: Psychiatry

## 2018-05-12 DIAGNOSIS — F4010 Social phobia, unspecified: Secondary | ICD-10-CM | POA: Diagnosis not present

## 2018-05-12 DIAGNOSIS — F17203 Nicotine dependence unspecified, with withdrawal: Secondary | ICD-10-CM

## 2018-05-12 DIAGNOSIS — G3184 Mild cognitive impairment, so stated: Secondary | ICD-10-CM | POA: Diagnosis not present

## 2018-05-12 DIAGNOSIS — F422 Mixed obsessional thoughts and acts: Secondary | ICD-10-CM | POA: Diagnosis not present

## 2018-05-12 DIAGNOSIS — F250 Schizoaffective disorder, bipolar type: Secondary | ICD-10-CM | POA: Diagnosis not present

## 2018-05-12 NOTE — Progress Notes (Addendum)
Matthew Reed [id number] May 26, 1975 43 y.o.   Virtual Visit via WebEx note  I connected with pt by audiovisual WebEx and verified that I am speaking with the correct person using two identifiers.   I discussed the limitations, risks, security and privacy concerns of performing an evaluation and management service by telephone and the availability of in person appointments. I also discussed with the patient that there may be a patient responsible charge related to this service. The patient expressed understanding and agreed to proceed.  I discussed the assessment and treatment plan with the patient. The patient was provided an opportunity to ask questions and all were answered. The patient agreed with the plan and demonstrated an understanding of the instructions.   The patient was advised to call back or seek an in-person evaluation if the symptoms worsen or if the condition fails to improve as anticipated.  I provided 15 minutes of non-face-to-face time during this encounter. The call started at 1 and ended at 115. The patient was located at home and the provider was located off office.   Subjective:   Patient ID:  Matthew Reed is a 43 y.o. (DOB 04/20/1975) male.  Chief Complaint:  Chief Complaint  Patient presents with  . Follow-up    Medication Management     HPI  Matthew Reed presents to the office today for follow-up of smoking and schizoaffective disorder, MCI and Se meds.  Last seen in January.    I've been doing great.  Had to stop Chantix but too tired on it.  It helped but se even at 1/2 twice daily.  Mood stable.  No mania.  Cut hours PT at work bc too stressed and that helped.  Patient reports stable mood and denies depressed or irritable moods.  Patient denies any recent difficulty with anxiety.  Patient denies difficulty with sleep initiation or maintenance. Denies appetite disturbance.  Patient reports that energy and motivation have been good.  Patient denies any  difficulty with concentration.  Patient denies any suicidal ideation.  Memory not normal but meds help.  Fearfulness, paranoia all good.  More comfortable standing up at church.  Can speak in public now.  Change in clozapine pills generic worried him but works fine.  Past Psychiatric Medication Trials:     Review of Systems:  Review of Systems  Neurological: Negative for tremors and weakness.  Psychiatric/Behavioral: Negative for agitation, behavioral problems, confusion, decreased concentration, dysphoric mood, hallucinations, self-injury, sleep disturbance and suicidal ideas. The patient is not nervous/anxious and is not hyperactive.   Not usually short of breath.  Medications: I have reviewed the patient's current medications.  Current Outpatient Medications  Medication Sig Dispense Refill  . cloZAPine (CLOZARIL) 100 MG tablet Take 7 tablets (700 mg total) by mouth at bedtime. 210 tablet 3  . lithium carbonate (ESKALITH) 450 MG CR tablet Take 1 tab (450 mg) qam & 1 1/2 tab at night 75 tablet 2  . modafinil (PROVIGIL) 200 MG tablet Take 1 tablet (200 mg total) by mouth every morning. 30 tablet 2  . propranolol (INDERAL) 20 MG tablet TAKE 1 TO 3 TABLETS BY MOUTH TWICE DAILY 540 tablet 0  . rivastigmine (EXELON) 4.5 MG capsule Take 1 capsule (4.5 mg total) by mouth 2 (two) times daily. 60 capsule 3   No current facility-administered medications for this visit.     Medication Side Effects: None  Allergies: No Known Allergies  Past Medical History:  Diagnosis Date  . Schizoaffective  disorder [facility])     Family History  Problem Relation Age of Onset  . Kidney cancer Mother   . Diabetes Mother   . Hypertension Mother   . Hyperlipidemia Mother   . Hypertension Father     Social History   Socioeconomic History  . Marital status: Married    Spouse name: Not on file  . Number of children: Not on file  . Years of education: Not on file  . Highest education level: Not on file   Occupational History  . 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  Social Needs  . Financial resource strain: Not on file  . Food insecurity:    Worry: Not on file    Inability: Not on file  . Transportation needs:    Medical: Not on file    Non-medical: Not on file  Tobacco Use  . Smoking status: Current Every Day Smoker    Packs/day: 1.00    Years: 25.00    Pack years: 25.00    Types: Cigarettes  . Smokeless tobacco: Never Used  Substance and Sexual Activity  . Alcohol use: No  . Drug use: No  . Sexual activity: Not on file  Lifestyle  . Physical activity:    Days per week: Not on file    Minutes per session: Not on file  . Stress: Not on file  Relationships  . Social connections:    Talks on phone: Not on file    Gets together: Not on file    Attends religious service: Not on file    Active member of club or organization: Not on file    Attends meetings of clubs or organizations: Not on file    Relationship status: Not on file  . Intimate partner violence:    Fear of current or ex partner: Not on file    Emotionally abused: Not on file    Physically abused: Not on file    Forced sexual activity: Not on file  Other Topics Concern  . Not on file  Social History Narrative  . Not on file    Past Medical History, Surgical history, Social history, and Family history were reviewed and updated as appropriate.   Please see review of systems for further details on the patient's review from today.   Objective:   Physical Exam:  There were no vitals taken for this visit.  Physical Exam Constitutional:      General: He is not in acute distress.    Appearance: He is well-developed.  Musculoskeletal:        General: No deformity.  Neurological:     Mental Status: He is alert and oriented to person, place, and time.     Motor: No tremor.     Coordination: Coordination normal.     Gait: Gait normal.  Psychiatric:        Attention and Perception:  Attention and perception normal. He is attentive.        Mood and Affect: Mood normal. Mood is not anxious or depressed. Affect is not labile, blunt, angry or inappropriate.        Speech: Speech normal.        Behavior: Behavior normal.        Thought Content: Thought content normal. Thought content does not include homicidal or suicidal ideation. Thought content does not include homicidal or suicidal plan.        Cognition and Memory: Cognition normal.  Judgment: Judgment normal.     Comments: Insight is good.     Lab Review:     Component Value Date/Time   NA 141 03/09/2007 2256   K 4.1 03/09/2007 2256   CL 105 03/09/2007 2256   CO2 28 03/09/2007 2256   GLUCOSE 107 (H) 03/09/2007 2256   BUN 7 03/09/2007 2256   CREATININE 0.94 03/09/2007 2256   CALCIUM 9.5 03/09/2007 2256   GFRNONAA >60 03/09/2007 2256   GFRAA  03/09/2007 2256    >60        The eGFR has been calculated using the MDRD equation. This calculation has not been validated in all clinical       Component Value Date/Time   WBC 4.7 03/09/2007 2256   RBC 5.06 03/09/2007 2256   HGB 16.1 03/09/2007 2256   HCT 45.7 03/09/2007 2256   PLT 164 03/09/2007 2256   MCV 90.2 03/09/2007 2256   MCHC 35.3 03/09/2007 2256   RDW 12.6 03/09/2007 2256   LYMPHSABS 2.1 03/09/2007 2256   MONOABS 0.4 03/09/2007 2256   EOSABS 0.0 03/09/2007 2256   BASOSABS 0.1 03/09/2007 2256    No results found for: POCLITH, LITHIUM   Lab Results  Component Value Date   VALPROATE 78.4 03/15/2007     .res Assessment: Plan:    Schizoaffective disorder, bipolar type ([place])  Mixed obsessional thoughts and acts  Nicotine dependence with withdrawal, unspecified nicotine product type  Mild cognitive impairment  Social anxiety disorder   He failed to tolerate even the lower dose of Chantix.  Though it was helpful.  His schizoaffective disorder symptoms including paranoia and irritability are well controlled with the current  medications.  Including the clozapine.  His CBC with differential has been stable and unremarkable.  These have been recorded in the paper chart because it is difficult to send the results to the pharmacy and our REMS through epic  Is mild cognitive impairment is improved with the medication.  His anxiety is manageable at the moment with current medications.  He is having good response to the clozapine and his blood levels have been good.  In fact the clozapine has been miraculous and resolving his treatment resistant paranoia.  He is not having significant mood swings on the mood stabilizers.  He is tolerating the current medications.  Follow-up 4 months  Lynder Parents, MD, DFAPA  Please see After Visit Summary for patient specific instructions.  No future appointments.  No orders of the defined types were placed in this encounter.     -------------------------------

## 2018-05-16 ENCOUNTER — Other Ambulatory Visit: Payer: Self-pay | Admitting: Psychiatry

## 2018-05-29 DIAGNOSIS — Z79899 Other long term (current) drug therapy: Secondary | ICD-10-CM | POA: Diagnosis not present

## 2018-05-29 DIAGNOSIS — F259 Schizoaffective disorder, unspecified: Secondary | ICD-10-CM | POA: Diagnosis not present

## 2018-06-02 ENCOUNTER — Other Ambulatory Visit: Payer: Self-pay | Admitting: Psychiatry

## 2018-06-24 ENCOUNTER — Other Ambulatory Visit: Payer: Self-pay

## 2018-06-24 MED ORDER — MODAFINIL 200 MG PO TABS: 200.0000 mg | ORAL_TABLET | ORAL | 2 refills | Status: DC

## 2018-06-30 DIAGNOSIS — F259 Schizoaffective disorder, unspecified: Secondary | ICD-10-CM | POA: Diagnosis not present

## 2018-06-30 DIAGNOSIS — Z79899 Other long term (current) drug therapy: Secondary | ICD-10-CM | POA: Diagnosis not present

## 2018-07-28 DIAGNOSIS — F259 Schizoaffective disorder, unspecified: Secondary | ICD-10-CM | POA: Diagnosis not present

## 2018-07-30 ENCOUNTER — Telehealth: Payer: Self-pay | Admitting: Psychiatry

## 2018-07-30 NOTE — Telephone Encounter (Signed)
Spoke with [facility] and they are going fax his results.

## 2018-07-30 NOTE — Telephone Encounter (Signed)
Patient had labs done on Monday at [facility] in [place], was calling to get results would like for [place] to contact them to get results

## 2018-07-31 ENCOUNTER — Other Ambulatory Visit: Payer: Self-pay | Admitting: Psychiatry

## 2018-07-31 NOTE — Telephone Encounter (Signed)
Results received, reviewed and faxed to [facility] and pharmacy for pt to receive his medications

## 2018-08-12 ENCOUNTER — Other Ambulatory Visit: Payer: Self-pay | Admitting: Psychiatry

## 2018-08-25 DIAGNOSIS — F259 Schizoaffective disorder, unspecified: Secondary | ICD-10-CM | POA: Diagnosis not present

## 2018-08-25 DIAGNOSIS — Z79899 Other long term (current) drug therapy: Secondary | ICD-10-CM | POA: Diagnosis not present

## 2018-08-27 ENCOUNTER — Telehealth: Payer: Self-pay | Admitting: Psychiatry

## 2018-08-27 NOTE — Telephone Encounter (Signed)
Pt called. Stated pharmacy will only allow him to get Clozapine Rx for 2 weeks. Labs not due. Has never gotten it for less than 1 month. Please check with Pharmacy

## 2018-08-28 NOTE — Telephone Encounter (Signed)
[facility] won't let us call to request anything until we are added as Physician administrators. I have added my name and Traci's. Once approved we can get access.  But I think the reason he only got two weeks worth is the pharmacy does not have the most recent lab dated  8/10. I am having [facility] send [place] that lab and we will contact pharmacy with it.

## 2018-08-28 NOTE — Telephone Encounter (Signed)
Per [facility] they used the lab dated 08/25/2018 to fill and [facility]S told them 2 weeks only. Have to wait until the REMS access is granted to call them and find out why.

## 2018-08-28 NOTE — Telephone Encounter (Signed)
Pharmacy not listed in message but will contact [facility] to check

## 2018-08-28 NOTE — Telephone Encounter (Signed)
He should still be getting clozapine in 4-week intervals.  Unfortunately his CBC does not come back in epic.  He gets faxed instead.  It is possible that his CBC results could be late.  Unfortunately I do not think they are getting filed any longer.  See if they will take a verbal order to change it back to 4-week quantities of clozapine please.

## 2018-08-29 ENCOUNTER — Other Ambulatory Visit: Payer: Self-pay

## 2018-08-29 MED ORDER — CLOZAPINE 100 MG PO TABS: ORAL_TABLET | ORAL | 3 refills | Status: DC

## 2018-09-01 ENCOUNTER — Telehealth: Payer: Self-pay | Admitting: Psychiatry

## 2018-09-02 NOTE — Telephone Encounter (Signed)
Not needed

## 2018-09-08 ENCOUNTER — Other Ambulatory Visit: Payer: Self-pay

## 2018-09-08 ENCOUNTER — Encounter: Payer: Self-pay | Admitting: Psychiatry

## 2018-09-08 ENCOUNTER — Ambulatory Visit (INDEPENDENT_AMBULATORY_CARE_PROVIDER_SITE_OTHER): Payer: Medicare Other | Admitting: Psychiatry

## 2018-09-08 DIAGNOSIS — F250 Schizoaffective disorder, bipolar type: Secondary | ICD-10-CM | POA: Diagnosis not present

## 2018-09-08 DIAGNOSIS — F17203 Nicotine dependence unspecified, with withdrawal: Secondary | ICD-10-CM | POA: Diagnosis not present

## 2018-09-08 DIAGNOSIS — F422 Mixed obsessional thoughts and acts: Secondary | ICD-10-CM

## 2018-09-08 DIAGNOSIS — G3184 Mild cognitive impairment, so stated: Secondary | ICD-10-CM | POA: Diagnosis not present

## 2018-09-08 DIAGNOSIS — F4010 Social phobia, unspecified: Secondary | ICD-10-CM | POA: Diagnosis not present

## 2018-09-08 MED ORDER — BUPROPION HCL ER (XL) 150 MG PO TB24: ORAL_TABLET | ORAL | 0 refills | Status: DC

## 2018-09-08 NOTE — Progress Notes (Signed)
Matthew Reed [id number] 02-14-1975 43 y.o.    Subjective:   Patient ID:  Matthew Reed is a 43 y.o. (DOB Apr 06, 1975) male.  Chief Complaint:  Chief Complaint  Patient presents with  . Memory Loss  . Fatigue  . Follow-up    med managment and several dx     HPI  Matthew Reed presents to the office today for follow-up of smoking and schizoaffective disorder, MCI and Se meds.  Last seen in April.    I've been doing great.  Had to stop Chantix but too tired on it.  It helped but se even at 1/2 twice daily.  He now wants to try Wellbutrin.  He took it remotely but does not remember the effect because but said it was many years ago.  He is still struggling to try to quit smoking.  Still preaches and tolerated critique.  No paranoia.  Mood stable.  No mania.  Cut hours PT at work bc too stressed and that helped.  Patient reports stable mood and denies depressed or irritable moods.  Patient denies any recent difficulty with anxiety.  Patient denies difficulty with sleep initiation or maintenance. Sleep 11-1028. Using BiPap. Denies appetite disturbance.  Patient reports that energy and motivation have been good.  Patient denies any difficulty with concentration.  Patient denies any suicidal ideation.  Memory same.  Stopped exelon bc decided it didn't help.  Fearfulness, paranoia all good.  More comfortable standing up at church.  Can speak in public now.  Change in clozapine pills generic worried him but works fine.   He wants to try again to reduce the clozapine because of excessive tiredness and sleepiness.  He is aware of the risk of it causing more anxiety and paranoia.  He reduced the dose and 2017 and gradually had worsening anxiety and paranoia at 500 mg daily.  Past Psychiatric Medication Trials:  On clozapine 700 since July 2018 to August 2020. Chantix N and irritable. Wellbutrin remotely.  Haldol dystonia olanzapine for many years, ziprasidone, risperidone, benzodiazepine  dependence remotely, modafinil, Chantix, propranolol, fluvoxamine, Aricept no response, Exelon partial response, Nuvigil, Provigil   Review of Systems:  Review of Systems  Constitutional: Positive for fatigue.  Neurological: Negative for tremors and weakness.  Psychiatric/Behavioral: Negative for agitation, behavioral problems, confusion, decreased concentration, dysphoric mood, hallucinations, self-injury, sleep disturbance and suicidal ideas. The patient is not nervous/anxious and is not hyperactive.   Not usually short of breath.  Medications: I have reviewed the patient's current medications.  Current Outpatient Medications  Medication Sig Dispense Refill  . cloZAPine (CLOZARIL) 100 MG tablet TAKE 7 TABLETS BY MOUTH EVERY NIGHT AT BEDTIME 210 tablet 3  . lithium carbonate (ESKALITH) 450 MG CR tablet TAKE 1 TABLET BY MOUTH EVERY MORNING AND 1 AND 1/2 TABLETS AT NIGHT. 75 tablet 2  . modafinil (PROVIGIL) 200 MG tablet Take 1 tablet (200 mg total) by mouth every morning. 30 tablet 2  . propranolol (INDERAL) 20 MG tablet TAKE 1 TO 3 TABLETS BY MOUTH TWICE DAILY 540 tablet 0  . buPROPion (WELLBUTRIN XL) 150 MG 24 hr tablet 1 each morning for 1 week then 2 each morning 30 tablet 0   No current facility-administered medications for this visit.     Medication Side Effect: fatigue  Allergies: No Known Allergies  Past Medical History:  Diagnosis Date  . Schizoaffective disorder ([place])     Family History  Problem Relation Age of Onset  . Kidney cancer Mother   .  Diabetes Mother   . Hypertension Mother   . Hyperlipidemia Mother   . Hypertension Father     Social History   Socioeconomic History  . Marital status: Married    Spouse name: Not on file  . Number of children: Not on file  . Years of education: Not on file  . Highest education level: Not on file  Occupational History  . 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  Social Needs  . Financial resource  strain: Not on file  . Food insecurity    Worry: Not on file    Inability: Not on file  . Transportation needs    Medical: Not on file    Non-medical: Not on file  Tobacco Use  . Smoking status: Current Every Day Smoker    Packs/day: 1.00    Years: 25.00    Pack years: 25.00    Types: Cigarettes  . Smokeless tobacco: Never Used  Substance and Sexual Activity  . Alcohol use: No  . Drug use: No  . Sexual activity: Not on file  Lifestyle  . Physical activity    Days per week: Not on file    Minutes per session: Not on file  . Stress: Not on file  Relationships  . Social Herbalist on phone: Not on file    Gets together: Not on file    Attends religious service: Not on file    Active member of club or organization: Not on file    Attends meetings of clubs or organizations: Not on file    Relationship status: Not on file  . Intimate partner violence    Fear of current or ex partner: Not on file    Emotionally abused: Not on file    Physically abused: Not on file    Forced sexual activity: Not on file  Other Topics Concern  . Not on file  Social History Narrative  . Not on file    Past Medical History, Surgical history, Social history, and Family history were reviewed and updated as appropriate.   Please see review of systems for further details on the patient's review from today.   Objective:   Physical Exam:  There were no vitals taken for this visit.  Physical Exam Constitutional:      General: He is not in acute distress.    Appearance: He is well-developed.  Musculoskeletal:        General: No deformity.  Neurological:     Mental Status: He is alert and oriented to person, place, and time.     Motor: No tremor.     Coordination: Coordination normal.     Gait: Gait normal.  Psychiatric:        Attention and Perception: Attention and perception normal. He is attentive.        Mood and Affect: Mood normal. Mood is not anxious or depressed. Affect  is not labile, blunt, angry or inappropriate.        Speech: Speech normal.        Behavior: Behavior normal.        Thought Content: Thought content normal. Thought content is not paranoid. Thought content does not include homicidal or suicidal ideation. Thought content does not include homicidal or suicidal plan.        Cognition and Memory: Cognition normal.        Judgment: Judgment normal.     Comments: Insight is good.  Lab Review:     Component Value Date/Time   NA 141 03/09/2007 2256   K 4.1 03/09/2007 2256   CL 105 03/09/2007 2256   CO2 28 03/09/2007 2256   GLUCOSE 107 (H) 03/09/2007 2256   BUN 7 03/09/2007 2256   CREATININE 0.94 03/09/2007 2256   CALCIUM 9.5 03/09/2007 2256   GFRNONAA >60 03/09/2007 2256   GFRAA  03/09/2007 2256    >60        The eGFR has been calculated using the MDRD equation. This calculation has not been validated in all clinical       Component Value Date/Time   WBC 4.7 03/09/2007 2256   RBC 5.06 03/09/2007 2256   HGB 16.1 03/09/2007 2256   HCT 45.7 03/09/2007 2256   PLT 164 03/09/2007 2256   MCV 90.2 03/09/2007 2256   MCHC 35.3 03/09/2007 2256   RDW 12.6 03/09/2007 2256   LYMPHSABS 2.1 03/09/2007 2256   MONOABS 0.4 03/09/2007 2256   EOSABS 0.0 03/09/2007 2256   BASOSABS 0.1 03/09/2007 2256    No results found for: POCLITH, LITHIUM   Lab Results  Component Value Date   VALPROATE 78.4 03/15/2007     .res Assessment: Plan:    Schizoaffective disorder, bipolar type ([place])  Nicotine dependence with withdrawal, unspecified nicotine product type - Plan: buPROPion (WELLBUTRIN XL) 150 MG 24 hr tablet  Mild cognitive impairment  Social anxiety disorder  Mixed obsessional thoughts and acts   He failed to tolerate even the lower dose of Chantix.  Though it was helpful.  His schizoaffective disorder symptoms including paranoia and irritability are well controlled with the current medications.  Including the clozapine.  His CBC  with differential has been stable and unremarkable.  These have been recorded in the paper chart because it is difficult to send the results to the pharmacy and our REMS through epic we discussed the side effects of clozapine in detail.  He is wanting to try reduction in clozapine because of the tiredness.  We have tried to mitigate that through the use of modafinil or Nuvigil with partial success.  We discussed the risk of worsening paranoia if we reduce the dosage.  He is willing to tolerate this risk. He is having good response to the clozapine and his blood levels have been good.  In fact the clozapine has been miraculous and resolving his treatment resistant paranoia.  He is not having significant mood swings on the mood stabilizers.   Is mild cognitive impairment is still present.  Stopped rivastigmine bc NR.  His anxiety is manageable at the moment with current medications.  Per his request OK trial Wellbutrin XL 150 to 300 mg AM retrial for smoking cessation DT failure of Chantix and nicotine patches.  Disc SE in detail.  Discussed the risk that this could increasing's anxiety or cause paranoia or mania.  Because of the severity of risk of smoking to his overall health it is reasonable to try this.    He is tolerating the current medications.  Follow-up 3 months  Lynder Parents, MD, DFAPA  Please see After Visit Summary for patient specific instructions. Wellbutrin (bupropion) XL 150 mg tablets 1 for 1 week then 2 each morning=300 mg each morning  Ok reduce clozapine to 6 nightly.  Call if anxiety or paranoia is worse.  Lynder Parents, MD, DFAPA   Future Appointments  Date Time Provider [place]  12/04/2018  1:00 PM Cottle, Billey Co., MD [facility] None  No orders of the defined types were placed in this encounter.     -------------------------------

## 2018-09-08 NOTE — Patient Instructions (Addendum)
Wellbutrin (bupropion) XL 150 mg tablets 1 for 1 week then 2 each morning=300 mg each morning  Ok reduce clozapine to 6 nightly.  Call if anxiety or paranoia is worse.

## 2018-09-26 ENCOUNTER — Other Ambulatory Visit: Payer: Self-pay

## 2018-09-26 MED ORDER — MODAFINIL 200 MG PO TABS: 200.0000 mg | ORAL_TABLET | ORAL | 2 refills | Status: DC

## 2018-09-30 ENCOUNTER — Other Ambulatory Visit: Payer: Self-pay | Admitting: Psychiatry

## 2018-09-30 ENCOUNTER — Telehealth: Payer: Self-pay | Admitting: Psychiatry

## 2018-09-30 ENCOUNTER — Other Ambulatory Visit: Payer: Self-pay

## 2018-09-30 DIAGNOSIS — F17203 Nicotine dependence unspecified, with withdrawal: Secondary | ICD-10-CM

## 2018-09-30 MED ORDER — BUPROPION HCL ER (XL) 300 MG PO TB24: 300.0000 mg | ORAL_TABLET | Freq: Every day | ORAL | 0 refills | Status: DC

## 2018-09-30 NOTE — Telephone Encounter (Signed)
Okay to change to 300 mg instead?

## 2018-09-30 NOTE — Telephone Encounter (Signed)
Clarification on Rx resubmitted

## 2018-09-30 NOTE — Telephone Encounter (Signed)
Pharmacy called. Need clarification on Wellbutrin XL 300 mg Rx sent today.

## 2018-09-30 NOTE — Telephone Encounter (Signed)
Clarification on Wellbutrin XL 300 mg dosing resubmitted to pharmacy

## 2018-10-06 DIAGNOSIS — F259 Schizoaffective disorder, unspecified: Secondary | ICD-10-CM | POA: Diagnosis not present

## 2018-10-06 DIAGNOSIS — Z79899 Other long term (current) drug therapy: Secondary | ICD-10-CM | POA: Diagnosis not present

## 2018-10-08 ENCOUNTER — Telehealth: Payer: Self-pay | Admitting: Psychiatry

## 2018-10-08 NOTE — Telephone Encounter (Signed)
I do not have these results.  Please call [facility] I assume his [facility] not [facility]'s CBC results faxed here ASAP so that he can get his clozapine.

## 2018-10-08 NOTE — Telephone Encounter (Signed)
Called and made pt. Aware that his labs were sent to his pharmacy.

## 2018-10-08 NOTE — Telephone Encounter (Signed)
Patient called to see if lab results were faxed from [facility] out of [place], please contact this lab to get results, they will not release them to the patient, he stated we would have to call.  Patient stated he has 1 more night left of clozapine

## 2018-10-20 ENCOUNTER — Other Ambulatory Visit: Payer: Self-pay | Admitting: Psychiatry

## 2018-10-20 ENCOUNTER — Telehealth: Payer: Self-pay | Admitting: Psychiatry

## 2018-10-20 DIAGNOSIS — F250 Schizoaffective disorder, bipolar type: Secondary | ICD-10-CM | POA: Diagnosis not present

## 2018-10-20 DIAGNOSIS — Z79899 Other long term (current) drug therapy: Secondary | ICD-10-CM | POA: Diagnosis not present

## 2018-10-20 NOTE — Telephone Encounter (Signed)
I do not see a recent lithium level in the chart.  He has been getting monthly CBCs but they are not coming back in the chart so I assume he is getting them at [facility].  I sent a request for lithium level to [facility].  Please ask him to get it as soon as possible so we can determine if dosage needs to be adjusted.

## 2018-10-20 NOTE — Telephone Encounter (Signed)
Pt is having pretty bad side effects he thinks its from the Lithium he is on. Pt's leg start twitching then he falls down. He said its been going on for awhile, but has got worse.

## 2018-10-22 ENCOUNTER — Encounter: Payer: Self-pay | Admitting: Psychiatry

## 2018-10-24 ENCOUNTER — Telehealth: Payer: Self-pay | Admitting: Pulmonary Disease

## 2018-10-24 ENCOUNTER — Other Ambulatory Visit: Payer: Self-pay | Admitting: Psychiatry

## 2018-10-24 ENCOUNTER — Telehealth: Payer: Self-pay | Admitting: Psychiatry

## 2018-10-24 MED ORDER — LAMOTRIGINE 25 MG PO TABS: ORAL_TABLET | ORAL | 1 refills | Status: DC

## 2018-10-24 NOTE — Telephone Encounter (Signed)
TC  Reduced lithium to 2 daily about 5 days and the jerks are improved.  [place] a couple of times.  Better now.    Has reduced the clozapine from 700 to 600 mg daily.  He's reduced his smoking by 1/2 or more.  This will tend to increase the blood clozapine.  If the sx continue mid next week then call and may require checking blood level of clozapine or adding lamotrigine.  Wellbutrin has helped reduce the smoking.  There are several issues that could be going on here.  Both lithium and clozapine can clear trigger myoclonic jerks.  He is already reduced the lithium.  He is also reduced to clozapine but he is also reduce smoking which will tend to increase the clozapine blood level.  Clozapine levels above 600 mg a day can trigger myoclonic jerks and potentially dose-related seizures.  Often lamotrigine is used to counteract this problem.  However there have been so many changes lately that it seems prudent to wait until next week once the lithium levels are more stable in order to better evaluate the situation.  If the jerks continue then we will probably check a blood level of clozapine and also add lamotrigine.  If jerks continue then call next week.  He agrees to this plan.

## 2018-10-24 NOTE — Telephone Encounter (Signed)
Patient stated he is waiting for a call regarding his lab work from [facility] in [place] wants to discuss results to decrease the Lithium

## 2018-10-24 NOTE — Telephone Encounter (Signed)
Dr. Clovis Pu has labs to review, see previous phone message

## 2018-10-24 NOTE — Telephone Encounter (Signed)
Patient's complaints are most consistent with myoclonic jerks.  These can occur with lithium.  They can also occur with clozapine.  They are more common when clozapine dosages are above 600 mg daily and the patient is taking 700 mg daily.  Serum lithium level was 1.0.  Serum BMP is within normal limits including creatinine 1.26 calcium 9.1.  TSH is also normal at 3.1  His lithium level is not high.  Usually lithium related jerks do not lead to falls.  It is more likely this is related to clozapine.  The treatment for clozapine related jerks is lamotrigine.  The last note from the patient states that he has already reduced the lithium by half tablet daily.  If he has done that for over a week then the lithium is not the cause and we need to start the lamotrigine.  We cannot reduce the lithium further.  It typically takes about 1 week for reduction in lithium dosage to lead to reduction in side effects.  If it has been 1 full week then it is necessary to start the low-dose lamotrigine as noted below.  If it has been less than 1 week he can wait until it has been 1 week and then notify us.  Because it is a Friday I will go ahead and send in the prescription for lamotrigine and he can choose to start it now or defer until he has been on the lower dose of lithium for 1 full week.  Instructions on lamotrigine dosing will be on the prescription.

## 2018-10-24 NOTE — Telephone Encounter (Signed)
Pt just trying to make sure he gets a call before the weekend. HE has reduced his lithium by  1/2 pill but still having issues.

## 2018-10-24 NOTE — Telephone Encounter (Signed)
Prescription sent for lamotrigine.  Remind him that this medicine can sometimes cause mild to severe rashes and is if he has any rash please let us know

## 2018-10-24 NOTE — Telephone Encounter (Signed)
Spoke with pt, he is requesting BIPAP supplies.   [facility] can we send order for CPAP supplies and to [place]    Patient Instructions by Chesley Mires, MD at 10/22/2017 11:00 AM Author: Chesley Mires, MD Author Type: Physician Filed: 10/22/2017 11:27 AM  Note Status: Signed Cosign: Cosign Not Required Encounter Date: 10/22/2017  Editor: Chesley Mires, MD (Physician)    Will arrange for new Bipap mask and supplies  Follow up in 1 year    Instructions    Return in about 1 year (around 10/23/2018). Will arrange for new Bipap mask and supplies  Follow up in 1 year       After Visit Summary (Printed 10/22/2017)

## 2018-10-24 NOTE — Telephone Encounter (Signed)
Left Matthew Reed a voicemail to call back about his labs

## 2018-10-27 NOTE — Telephone Encounter (Signed)
Patient has not been seen since 10/22/2017 by Dr. Halford Chessman. Patient will need to be set up with video or in office visit for compliance.

## 2018-10-27 NOTE — Telephone Encounter (Signed)
Pt calling back about order for CPAP supplies.  Please advise. [phone].

## 2018-10-27 NOTE — Telephone Encounter (Signed)
Called the patient and advised that his request was received, but it has been year so he would need a visit with Dr. Halford Chessman either in person or a video visit. Patient agreed to a video visit. He confirmed he has a computer with camera. Patient given code and set up for appointment on 10/30/18 at 10:45. Nothing further needed at this time.

## 2018-10-27 NOTE — Telephone Encounter (Signed)
Called [facility] at [phone], Alyse Low transferred the call to the intake department. Spoke with Joellen Jersey, she confirmed the fax sent to them was not received. She requested the order be resent to the Intake Department fax # [phone].  Unable to reach the patient, the home number does not have vmail. Line rang multiple times without pick up.  Message routed to [facility] Heide Guile) for follow up.

## 2018-10-30 ENCOUNTER — Ambulatory Visit (INDEPENDENT_AMBULATORY_CARE_PROVIDER_SITE_OTHER): Payer: Medicare Other | Admitting: Pulmonary Disease

## 2018-10-30 ENCOUNTER — Telehealth: Payer: Self-pay | Admitting: Psychiatry

## 2018-10-30 ENCOUNTER — Telehealth: Payer: Medicare Other | Admitting: Pulmonary Disease

## 2018-10-30 ENCOUNTER — Other Ambulatory Visit: Payer: Self-pay

## 2018-10-30 ENCOUNTER — Encounter: Payer: Self-pay | Admitting: Pulmonary Disease

## 2018-10-30 DIAGNOSIS — Z720 Tobacco use: Secondary | ICD-10-CM

## 2018-10-30 DIAGNOSIS — G4733 Obstructive sleep apnea (adult) (pediatric): Secondary | ICD-10-CM | POA: Diagnosis not present

## 2018-10-30 NOTE — Progress Notes (Signed)
[facility]  Chief Complaint  Patient presents with  . OSA treated with BIPAP.    Needs replacement supplies.    Constitutional:  There were no vitals taken for this visit.  Deferred.  Past Medical History:  Schizoaffective disorder  Brief Summary:  Matthew Reed is a 43 y.o. male  with obstructive sleep apnea.  Virtual Visit via Telephone Note  I connected with Matthew Reed on 10/30/18 at 10:45 AM EDT by telephone and verified that I am speaking with the correct person using two identifiers.  Location: Patient: home Provider: medical office   I discussed the limitations, risks, security and privacy concerns of performing an evaluation and management service by telephone and the availability of in person appointments. I also discussed with the patient that there may be a patient responsible charge related to this service. The patient expressed understanding and agreed to proceed.  Needs new mask and supplies.  No issues with pressure settings.  Denies sinus congestion, sore throat, dry mouth, or aerophagia.  Physical Exam:  Deferred.   Assessment/Plan:   Obstructive sleep apnea. - he is compliant with Bipap and reports benefit - continue Bipap 14/10 cm H2O - will arrange for new supplies  Tobacco abuse. - discussed importance of smoking cessation   Patient Instructions  Will arrange for new CPAP supplies  Follow up in 1 year    I discussed the assessment and treatment plan with the patient. The patient was provided an opportunity to ask questions and all were answered. The patient agreed with the plan and demonstrated an understanding of the instructions.   The patient was advised to call back or seek an in-person evaluation if the symptoms worsen or if the condition fails to improve as anticipated.  I provided 8 minutes of non-face-to-face time during this encounter.   Chesley Mires, MD [place] Pager: [phone] 10/30/2018, 11:43 AM  Flow Sheet    Sleep tests:  PSG 2010 >> AHI 49 Bipap 09/29/18 to 10/28/18 >> used on 30 of 30 nights with average 10 hrs 20 min.  Average AHI 0.4 with Bipap 14/10 cm H2O.  Medications:   Allergies as of 10/30/2018   No Known Allergies     Medication List       Accurate as of October 30, 2018 11:43 AM. If you have any questions, ask your nurse or doctor.        STOP taking these medications   lamoTRIgine 25 MG tablet Commonly known as: LaMICtal Stopped by: Chesley Mires, MD     TAKE these medications   buPROPion 300 MG 24 hr tablet Commonly known as: WELLBUTRIN XL Take 1 tablet (300 mg total) by mouth daily.   cloZAPine 100 MG tablet Commonly known as: CLOZARIL TAKE 7 TABLETS BY MOUTH EVERY NIGHT AT BEDTIME   lithium carbonate 450 MG CR tablet Commonly known as: ESKALITH TAKE 1 TABLET BY MOUTH EVERY MORNING AND 1 AND 1/2 TABLETS AT NIGHT.   modafinil 200 MG tablet Commonly known as: PROVIGIL Take 1 tablet (200 mg total) by mouth every morning.   propranolol 20 MG tablet Commonly known as: INDERAL TAKE 1 TO 3 TABLETS BY MOUTH TWICE DAILY       Past Surgical History:  He  has a past surgical history that includes No past surgeries.  Family History:  His family history includes Diabetes in his mother; Hyperlipidemia in his mother; Hypertension in his father and mother; Kidney cancer  in his mother.  Social History:  He  reports that he has been smoking cigarettes. He has a 25.00 pack-year smoking history. He has never used smokeless tobacco. He reports that he does not drink alcohol or use drugs.

## 2018-10-30 NOTE — Telephone Encounter (Signed)
He called previously complaining of muscle jerks that would occasionally call him to cause him to fall.  I prescribed lamotrigine to help with that problem.  If he does not feel that he needs it he can avoid taking it.  If he still having the muscle jerks then he should start taking the medicine.  Noticed that he has seen another provider who told him to stop it.  If he has an alternative treatment for the muscle jerks then he can choose to try the alternative treatment from the other doctor.

## 2018-10-30 NOTE — Telephone Encounter (Signed)
Matthew Reed called to ask if he was to start the new medicine for seizures, tic.He has picked it up from the pharmacy but was unsure whether or not to start it.  Please call to verify.  Next appt 11/19

## 2018-10-30 NOTE — Patient Instructions (Signed)
Will arrange for new CPAP supplies  Follow up in 1 year 

## 2018-10-31 ENCOUNTER — Telehealth: Payer: Self-pay | Admitting: Pulmonary Disease

## 2018-10-31 DIAGNOSIS — G4733 Obstructive sleep apnea (adult) (pediatric): Secondary | ICD-10-CM

## 2018-10-31 NOTE — Telephone Encounter (Signed)
Okay to send order. 

## 2018-10-31 NOTE — Telephone Encounter (Signed)
Dr. Halford Chessman can we send order for Bipap supplies?

## 2018-10-31 NOTE — Telephone Encounter (Signed)
Spoke with patient. He is aware that [facility] has approved the order for supplies. Order has been placed.   Nothing further needed at time of call.

## 2018-10-31 NOTE — Telephone Encounter (Signed)
Okay.  There is evidence that dosages of clozapine above 600 mg can trigger myoclonic jerks and that can be a precursor for seizures from clozapine at high dosage.  If he is no longer having the myoclonic jerks then he does not need to take the medication per his request.

## 2018-11-03 ENCOUNTER — Other Ambulatory Visit: Payer: Self-pay | Admitting: Psychiatry

## 2018-11-06 DIAGNOSIS — Z79899 Other long term (current) drug therapy: Secondary | ICD-10-CM | POA: Diagnosis not present

## 2018-11-06 DIAGNOSIS — F259 Schizoaffective disorder, unspecified: Secondary | ICD-10-CM | POA: Diagnosis not present

## 2018-11-11 ENCOUNTER — Telehealth: Payer: Self-pay | Admitting: Psychiatry

## 2018-11-11 NOTE — Telephone Encounter (Signed)
Patient called to see if lab results were sent from [facility] in [place], patient stated he only has 1 more dose of Clozapine left and doesn't know what he's going to do

## 2018-11-11 NOTE — Telephone Encounter (Signed)
I don't have the results.  Call the lab to send them ASAP

## 2018-11-13 NOTE — Telephone Encounter (Signed)
This has been done.

## 2018-11-18 ENCOUNTER — Telehealth: Payer: Self-pay | Admitting: Psychiatry

## 2018-11-18 NOTE — Telephone Encounter (Signed)
Patient called and said that he would like to go down to from 6 pills to 5 pills on his clozapine since he is still having seizures. Please give him a call at [phone]

## 2018-11-18 NOTE — Telephone Encounter (Signed)
Put him on the cancellation list for 30 minutes

## 2018-11-18 NOTE — Telephone Encounter (Signed)
We have reduced lithium and we have reduced clozapine since his last visit.  We need another visit before we reduce any medications any further.  Do not reduce the clozapine further.  His lithium level was normal at 1.0 in October before the reduction.

## 2018-11-18 NOTE — Telephone Encounter (Signed)
Patient is using the term seizures but based on what he is previously described they are myoclonic jerks.  There is no loss of consciousness.  Further med changes are made will need to chain the lithium level that was supposedly done on October 5.  Lithium was reduced because of these symptoms.  If they are no better we may need to consider increasing the lithium back to the previous level.

## 2018-11-19 NOTE — Telephone Encounter (Signed)
Discussed with Mykal and he verbalized understanding, that is correct it's not a seizure he said the myoclonic jerk is what he's experiencing. Instructed to call back with worsening symptoms before his appt.

## 2018-12-01 ENCOUNTER — Other Ambulatory Visit: Payer: Self-pay | Admitting: Psychiatry

## 2018-12-04 ENCOUNTER — Ambulatory Visit (INDEPENDENT_AMBULATORY_CARE_PROVIDER_SITE_OTHER): Payer: Medicare Other | Admitting: Psychiatry

## 2018-12-04 ENCOUNTER — Other Ambulatory Visit: Payer: Self-pay

## 2018-12-04 ENCOUNTER — Encounter: Payer: Self-pay | Admitting: Psychiatry

## 2018-12-04 DIAGNOSIS — F250 Schizoaffective disorder, bipolar type: Secondary | ICD-10-CM | POA: Diagnosis not present

## 2018-12-04 DIAGNOSIS — F4010 Social phobia, unspecified: Secondary | ICD-10-CM

## 2018-12-04 DIAGNOSIS — G3184 Mild cognitive impairment, so stated: Secondary | ICD-10-CM

## 2018-12-04 DIAGNOSIS — Z79899 Other long term (current) drug therapy: Secondary | ICD-10-CM | POA: Diagnosis not present

## 2018-12-04 DIAGNOSIS — G253 Myoclonus: Secondary | ICD-10-CM | POA: Diagnosis not present

## 2018-12-04 DIAGNOSIS — F422 Mixed obsessional thoughts and acts: Secondary | ICD-10-CM | POA: Diagnosis not present

## 2018-12-04 DIAGNOSIS — F17203 Nicotine dependence unspecified, with withdrawal: Secondary | ICD-10-CM | POA: Diagnosis not present

## 2018-12-04 NOTE — Patient Instructions (Addendum)
Reduce clozapine to 4 tablets nightly Call if anxiety or paranoia is worse or if the jerks are not better by November 26

## 2018-12-04 NOTE — Progress Notes (Signed)
[name] [id number] 25-Mar-1975 43 y.o.    Subjective:   Patient ID:  Matthew Reed is a 43 y.o. (DOB 10-05-1975) male.  Chief Complaint:  Chief Complaint  Patient presents with  . Follow-up    Mediction Management and medicine changes  . Other    Schizoaffective disorder, bipolar type  . Medication Problem    Jerks     HPI  [name] presents to the office today for follow-up of smoking and schizoaffective disorder, MCI and Se meds.  Last seen August 2020.  He wanted to have a trial of Wellbutrin for smoking cessation that was initiated 150 mg in the morning to then increase to 300 mg, the usual dose.  Patient called October 5 with the following complaint:Pt is having pretty bad side effects he thinks its from the Lithium he is on. Pt's leg start twitching then he falls down. He said its been going on for awhile, but has got worse.  He was given the following response from our office: Patient's complaints are most consistent with myoclonic jerks.  These can occur with lithium.  They can also occur with clozapine.  They are more common when clozapine dosages are above 600 mg daily and the patient is taking 700 mg daily. Serum lithium level was 1.0.  Serum BMP is within normal limits including creatinine 1.26 calcium 9.1.  TSH is also normal at 3.1 His lithium level is not high.  Usually lithium related jerks do not lead to falls.  It is more likely this is related to clozapine.  The treatment for clozapine related jerks is lamotrigine. The last note from the patient states that he has already reduced the lithium by half tablet daily.  If he has done that for over a week then the lithium is not the cause and we need to start the lamotrigine.  We cannot reduce the lithium further.  It typically takes about 1 week for reduction in lithium dosage to lead to reduction in side effects.  If it has been 1 full week then it is necessary to start the low-dose lamotrigine as noted  below.  If it has been less than 1 week he can wait until it has been 1 week and then notify us. Because it is a Friday I will go ahead and send in the prescription for lamotrigine and he can choose to start it now or defer until he has been on the lower dose of lithium for 1 full week.  Instructions on lamotrigine dosing will be on the prescription.  TC Reduced lithium to 2 daily about 5 days and the jerks are improved.  [place] a couple of times.  Better now.   Has reduced the clozapine from 700 to 600 mg daily. He's reduced his smoking by 1/2 or more.  This will tend to increase the blood clozapine. If the sx continue mid next week then call and may require checking blood level of clozapine or adding lamotrigine.  Wellbutrin has helped reduce the smoking. There are several issues that could be going on here.  Both lithium and clozapine can clear trigger myoclonic jerks.  He is already reduced the lithium.  He is also reduced to clozapine but he is also reduce smoking which will tend to increase the clozapine blood level.  Clozapine levels above 600 mg a day can trigger myoclonic jerks and potentially dose-related seizures.  Often lamotrigine is used to counteract this problem.  However there have been so  many changes lately that it seems prudent to wait until next week once the lithium levels are more stable in order to better evaluate the situation.  If the jerks continue then we will probably check a blood level of clozapine and also add lamotrigine. If jerks continue then call next week.  He agrees to this plan.  His lithium level in October before he reduced lithium was 1.0 Since the last appointment he has reduced both lithium and clozapine because of jerks.   He just increased the lamotrigine to 100 within the last week and hasn't seen changes with the jerks.  They are scary bc have caused falls 3-4 times since here but last time was bc tripped.  Legs and arms will jerk.  Less tremor  lately with hands.  Legs will get week and shake and not feel strong enough to carry him.  If he tries to lift something over 20# it will happen.  No unusual back problems.  Hx sciatica resolved.  Reduced smoking from 40 to 10 daily.  Wellbutrin is helping.    I've been doing great.  Had to stop Chantix but too tired on it.  It helped but se even at 1/2 twice daily.  He now wants to try Wellbutrin.  He took it remotely but does not remember the effect because but said it was many years ago.  He is still struggling to try to quit smoking.  Still preaches and tolerated critique.  No paranoia.  Mood stable.  No mania.  Cut hours PT at work bc too stressed and that helped.  Patient reports stable mood and denies depressed or irritable moods.  Patient denies any recent difficulty with anxiety.  Patient denies difficulty with sleep initiation or maintenance. Sleep 11-1028. Using BiPap. Denies appetite disturbance.  Patient reports that energy and motivation have been good.  Patient denies any difficulty with concentration.  Patient denies any suicidal ideation.  Memory same.  Stopped exelon bc decided it didn't help.  Fearfulness, paranoia all good.  More comfortable standing up at church.  Can speak in public now.  Change in clozapine pills generic worried him but works fine.   He wants to try again to reduce the clozapine because of excessive tiredness and sleepiness.  He is aware of the risk of it causing more anxiety and paranoia.  He reduced the dose and 2017 and gradually had worsening anxiety and paranoia at 500 mg daily.  Past Psychiatric Medication Trials:  On clozapine 700 since July 2018 to August 2020. Chantix N and irritable. Wellbutrin remotely.  Haldol dystonia olanzapine for many years, ziprasidone, risperidone, benzodiazepine dependence remotely, modafinil, Chantix, propranolol, fluvoxamine, Aricept no response, Exelon partial response, Nuvigil, Provigil   Review of Systems:  Review of  Systems  Constitutional: Positive for fatigue.  Neurological: Positive for weakness. Negative for tremors.  Psychiatric/Behavioral: Negative for agitation, behavioral problems, confusion, decreased concentration, dysphoric mood, hallucinations, self-injury, sleep disturbance and suicidal ideas. The patient is not nervous/anxious and is not hyperactive.   Not usually short of breath.  Medications: I have reviewed the patient's current medications.  Current Outpatient Medications  Medication Sig Dispense Refill  . buPROPion (WELLBUTRIN XL) 300 MG 24 hr tablet Take 1 tablet (300 mg total) by mouth daily. 90 tablet 0  . cloZAPine (CLOZARIL) 100 MG tablet TAKE 7 TABLETS BY MOUTH EVERY NIGHT AT BEDTIME (Patient taking differently: Take 500 mg by mouth daily. ) 210 tablet 3  . lamoTRIgine (LAMICTAL) 100 MG tablet Take  100 mg by mouth daily.    Marland Kitchen lithium carbonate (ESKALITH) 450 MG CR tablet TAKE 1 TABLET BY MOUTH EVERY MORNING AND 1 AND 1/2 TABLETS AT NIGHT. (Patient taking differently: Take 450 mg by mouth 2 (two) times daily. ) 75 tablet 2  . modafinil (PROVIGIL) 200 MG tablet Take 1 tablet (200 mg total) by mouth every morning. 30 tablet 2  . propranolol (INDERAL) 20 MG tablet TAKE 1 TO 3 TABLETS BY MOUTH TWICE DAILY 540 tablet 0   No current facility-administered medications for this visit.     Medication Side Effect: fatigue  Allergies: No Known Allergies  Past Medical History:  Diagnosis Date  . Schizoaffective disorder ([place])     Family History  Problem Relation Age of Onset  . Kidney cancer Mother   . Diabetes Mother   . Hypertension Mother   . Hyperlipidemia Mother   . Hypertension Father     Social History   Socioeconomic History  . Marital status: Married    Spouse name: Not on file  . Number of children: Not on file  . Years of education: Not on file  . Highest education level: Not on file  Occupational History  . 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  Social Needs  . Financial resource strain: Not on file  . Food insecurity    Worry: Not on file    Inability: Not on file  . Transportation needs    Medical: Not on file    Non-medical: Not on file  Tobacco Use  . Smoking status: Current Every Day Smoker    Packs/day: 1.00    Years: 25.00    Pack years: 25.00    Types: Cigarettes  . Smokeless tobacco: Never Used  Substance and Sexual Activity  . Alcohol use: No  . Drug use: No  . Sexual activity: Not on file  Lifestyle  . Physical activity    Days per week: Not on file    Minutes per session: Not on file  . Stress: Not on file  Relationships  . Social Herbalist on phone: Not on file    Gets together: Not on file    Attends religious service: Not on file    Active member of club or organization: Not on file    Attends meetings of clubs or organizations: Not on file    Relationship status: Not on file  . Intimate partner violence    Fear of current or ex partner: Not on file    Emotionally abused: Not on file    Physically abused: Not on file    Forced sexual activity: Not on file  Other Topics Concern  . Not on file  Social History Narrative  . Not on file    Past Medical History, Surgical history, Social history, and Family history were reviewed and updated as appropriate.   Please see review of systems for further details on the patient's review from today.   Objective:   Physical Exam:  There were no vitals taken for this visit.  Physical Exam Constitutional:      General: He is not in acute distress.    Appearance: He is well-developed.  Musculoskeletal:        General: No deformity.  Neurological:     Mental Status: He is alert and oriented to person, place, and time.     Motor: No tremor.     Coordination: Coordination normal.  Gait: Gait normal.  Psychiatric:        Attention and Perception: Attention and perception normal. He is attentive.        Mood and Affect: Mood  normal. Mood is not anxious or depressed. Affect is not labile, blunt, angry or inappropriate.        Speech: Speech normal.        Behavior: Behavior normal.        Thought Content: Thought content normal. Thought content is not paranoid. Thought content does not include homicidal or suicidal ideation. Thought content does not include homicidal or suicidal plan.        Cognition and Memory: Cognition normal.        Judgment: Judgment normal.     Comments: Insight is good.     Lab Review:     Component Value Date/Time   NA 141 03/09/2007 2256   K 4.1 03/09/2007 2256   CL 105 03/09/2007 2256   CO2 28 03/09/2007 2256   GLUCOSE 107 (H) 03/09/2007 2256   BUN 7 03/09/2007 2256   CREATININE 0.94 03/09/2007 2256   CALCIUM 9.5 03/09/2007 2256   GFRNONAA >60 03/09/2007 2256   GFRAA  03/09/2007 2256    >60        The eGFR has been calculated using the MDRD equation. This calculation has not been validated in all clinical       Component Value Date/Time   WBC 4.7 03/09/2007 2256   RBC 5.06 03/09/2007 2256   HGB 16.1 03/09/2007 2256   HCT 45.7 03/09/2007 2256   PLT 164 03/09/2007 2256   MCV 90.2 03/09/2007 2256   MCHC 35.3 03/09/2007 2256   RDW 12.6 03/09/2007 2256   LYMPHSABS 2.1 03/09/2007 2256   MONOABS 0.4 03/09/2007 2256   EOSABS 0.0 03/09/2007 2256   BASOSABS 0.1 03/09/2007 2256    No results found for: POCLITH, LITHIUM   Lab Results  Component Value Date   VALPROATE 78.4 03/15/2007     .res Assessment: Plan:    Schizoaffective disorder, bipolar type ([place])  Myoclonic jerking  Nicotine dependence with withdrawal, unspecified nicotine product type  Mild cognitive impairment  Social anxiety disorder  Mixed obsessional thoughts and acts  Lithium use   He failed to tolerate even the lower dose of Chantix.  Though it was helpful.  His schizoaffective disorder symptoms including paranoia and irritability are well controlled with the current medications.   Including the clozapine.  His CBC with differential has been stable and unremarkable.  These have been recorded in the paper chart because it is difficult to send the results to the pharmacy and our REMS through epic we discussed the side effects of clozapine in detail.    .  We have tried to mitigate excessive tiredness from clozapine that through the use of modafinil or Nuvigil with partial success. He is having myoclonic jerks in legs likely from clozapine.     We discussed the risk of worsening paranoia if we reduce the dosage.  He is willing to tolerate this risk.  He has mild cognitive impairment is still present.  Stopped rivastigmine bc NR.  His anxiety is manageable at the moment with current medications.  He's only been on lamotrigine 100 for 1 week so have to wait to increase further.   OK trial Wellbutrin XL  300 mg AM retrial for smoking cessation DT failure of Chantix and nicotine patches.  Disc SE in detail.  Discussed the risk that this could  increasing's anxiety or cause paranoia or mania.  Because of the severity of risk of smoking to his overall health it is reasonable to try this.  Disc DDI clozapine and smoking.  Evidence suggests 40% lower levels in smokers which may require reducing the clozapine as he quits smoking.  He has reduced from 700 to 500 mg in the last week.   OK reduce clozapine to 400 mg daily.  Disc relapse risk. Call if anxiety or paranoia is worse or if the jerks are not better by November 26 at which point we could increase the lamotrigine if needed  Follow-up 6 weeks  Lynder Parents, MD, DFAPA   Future Appointments  Date Time Provider [place]  01/20/2019  1:45 PM Cottle, Billey Co., MD [facility] None    No orders of the defined types were placed in this encounter.     -------------------------------

## 2018-12-16 ENCOUNTER — Other Ambulatory Visit: Payer: Self-pay | Admitting: Psychiatry

## 2018-12-16 ENCOUNTER — Telehealth: Payer: Self-pay | Admitting: Psychiatry

## 2018-12-16 NOTE — Telephone Encounter (Signed)
Patient called and said that the leg jerks are better but are still there so he wants to know if he can increase his medicine that helps with the leg jerks. His pharmacy is the [facility] at [address] in [place]. His phone is [phone]

## 2018-12-16 NOTE — Telephone Encounter (Signed)
He can increase lamotrigine to 150 mg each morning.

## 2018-12-17 ENCOUNTER — Other Ambulatory Visit: Payer: Self-pay

## 2018-12-17 MED ORDER — LAMOTRIGINE 150 MG PO TABS: ORAL_TABLET | ORAL | 0 refills | Status: DC

## 2018-12-17 NOTE — Telephone Encounter (Signed)
Patient notified of increase, will submit Rx to [facility]. Verbalized understanding.

## 2018-12-22 DIAGNOSIS — Z79899 Other long term (current) drug therapy: Secondary | ICD-10-CM | POA: Diagnosis not present

## 2018-12-22 DIAGNOSIS — F259 Schizoaffective disorder, unspecified: Secondary | ICD-10-CM | POA: Diagnosis not present

## 2018-12-25 ENCOUNTER — Other Ambulatory Visit: Payer: Self-pay | Admitting: Psychiatry

## 2018-12-25 ENCOUNTER — Other Ambulatory Visit: Payer: Self-pay

## 2018-12-25 DIAGNOSIS — F17203 Nicotine dependence unspecified, with withdrawal: Secondary | ICD-10-CM

## 2018-12-26 ENCOUNTER — Encounter: Payer: Self-pay | Admitting: Psychiatry

## 2018-12-26 MED ORDER — MODAFINIL 200 MG PO TABS: 200.0000 mg | ORAL_TABLET | ORAL | 5 refills | Status: DC

## 2019-01-20 ENCOUNTER — Other Ambulatory Visit: Payer: Self-pay

## 2019-01-20 ENCOUNTER — Encounter: Payer: Self-pay | Admitting: Psychiatry

## 2019-01-20 ENCOUNTER — Ambulatory Visit (INDEPENDENT_AMBULATORY_CARE_PROVIDER_SITE_OTHER): Payer: Medicare Other | Admitting: Psychiatry

## 2019-01-20 ENCOUNTER — Other Ambulatory Visit: Payer: Self-pay | Admitting: Psychiatry

## 2019-01-20 DIAGNOSIS — F17203 Nicotine dependence unspecified, with withdrawal: Secondary | ICD-10-CM

## 2019-01-20 DIAGNOSIS — G3184 Mild cognitive impairment, so stated: Secondary | ICD-10-CM

## 2019-01-20 DIAGNOSIS — F250 Schizoaffective disorder, bipolar type: Secondary | ICD-10-CM

## 2019-01-20 DIAGNOSIS — F422 Mixed obsessional thoughts and acts: Secondary | ICD-10-CM

## 2019-01-20 DIAGNOSIS — F4010 Social phobia, unspecified: Secondary | ICD-10-CM

## 2019-01-20 DIAGNOSIS — F411 Generalized anxiety disorder: Secondary | ICD-10-CM

## 2019-01-20 DIAGNOSIS — Z79899 Other long term (current) drug therapy: Secondary | ICD-10-CM | POA: Diagnosis not present

## 2019-01-20 DIAGNOSIS — G253 Myoclonus: Secondary | ICD-10-CM

## 2019-01-20 MED ORDER — BUPROPION HCL ER (XL) 150 MG PO TB24: 450.0000 mg | ORAL_TABLET | Freq: Every day | ORAL | 1 refills | Status: DC

## 2019-01-20 NOTE — Progress Notes (Signed)
[name] [id number] 12-Oct-1975 44 y.o.    Subjective:   Patient ID:  Matthew Reed is a 44 y.o. (DOB 1975/05/15) male.  Chief Complaint:  Chief Complaint  Patient presents with  . Follow-up    Medication Management and change  . Other    schizoaffective disorder, bipolar type  . Nicotine Dependence     HPI  [name] presents to the office today for follow-up of smoking and schizoaffective disorder, MCI and Se meds.  seen August 2020.  He wanted to have a trial of Wellbutrin for smoking cessation that was initiated 150 mg in the morning to then increase to 300 mg, the usual dose.  Patient called October 5 with the following complaint:Pt is having pretty bad side effects he thinks its from the Lithium he is on. Pt's leg start twitching then he falls down. He said its been going on for awhile, but has got worse.  He was given the following response from our office: Patient's complaints are most consistent with myoclonic jerks.  These can occur with lithium.  They can also occur with clozapine.  They are more common when clozapine dosages are above 600 mg daily and the patient is taking 700 mg daily. Serum lithium level was 1.0.  Serum BMP is within normal limits including creatinine 1.26 calcium 9.1.  TSH is also normal at 3.1 His lithium level is not high.  Usually lithium related jerks do not lead to falls.  It is more likely this is related to clozapine.  The treatment for clozapine related jerks is lamotrigine. The last note from the patient states that he has already reduced the lithium by half tablet daily.  If he has done that for over a week then the lithium is not the cause and we need to start the lamotrigine.  We cannot reduce the lithium further.  It typically takes about 1 week for reduction in lithium dosage to lead to reduction in side effects.  If it has been 1 full week then it is necessary to start the low-dose lamotrigine as noted below.  If it has been  less than 1 week he can wait until it has been 1 week and then notify us. Because it is a Friday I will go ahead and send in the prescription for lamotrigine and he can choose to start it now or defer until he has been on the lower dose of lithium for 1 full week.  Instructions on lamotrigine dosing will be on the prescription.  TC Reduced lithium to 2 daily about 5 days and the jerks are improved.  [place] a couple of times.  Better now.   Has reduced the clozapine from 700 to 600 mg daily. He's reduced his smoking by 1/2 or more.  This will tend to increase the blood clozapine. If the sx continue mid next week then call and may require checking blood level of clozapine or adding lamotrigine.  Wellbutrin has helped reduce the smoking. There are several issues that could be going on here.  Both lithium and clozapine can clear trigger myoclonic jerks.  He is already reduced the lithium.  He is also reduced to clozapine but he is also reduce smoking which will tend to increase the clozapine blood level.  Clozapine levels above 600 mg a day can trigger myoclonic jerks and potentially dose-related seizures.  Often lamotrigine is used to counteract this problem.  However there have been so many changes lately that it seems  prudent to wait until next week once the lithium levels are more stable in order to better evaluate the situation.  If the jerks continue then we will probably check a blood level of clozapine and also add lamotrigine. If jerks continue then call next week.  He agrees to this plan.  His lithium level in October before he reduced lithium was 1.0 Since that appointment he has reduced both lithium and clozapine because of jerks.   Last seen December 04, 2018 and the following was discussed. He just increased the lamotrigine to 100 early November and hasn't seen changes with the jerks.  They are scary bc have caused falls 3-4 times since here but last time was bc tripped.  Legs and arms  will jerk.  Less tremor lately with hands.  Legs will get week and shake and not feel strong enough to carry him.  If he tries to lift something over 20# it will happen.  No unusual back problems.  Hx sciatica resolved. Reduced smoking from 40 to 10 daily with Wellbutrin 150 helping.    OK trial Wellbutrin XL  300 mg AM retrial for smoking cessation DT failure of Chantix and nicotine patches.  Disc SE in detail.  Discussed the risk that this could increasing's anxiety or cause paranoia or mania.  Because of the severity of risk of smoking to his overall health it is reasonable to try this. Disc DDI clozapine and smoking.  Evidence suggests 40% lower levels in smokers which may require reducing the clozapine as he quits smoking.  He has reduced from 700 to 500 mg in the last week.   OK reduce clozapine to 400 mg daily.  Disc relapse risk.  He called December 1 reporting the leg jerks were better but not gone and wanted an increase to low lamotrigine to try to help with this problem.  It was increased from 100 to 150 mg daily at that time.  No problems are worse with reduction in clozapine to 400 mg and sleepiness is better.  Still tends to worry over things too much.  Paranoia still controlled. No mood swings.   Cigarettes 10-12 daily.  Wellbutrin helped him cut back.  Wants to stop. Jerks are 80-90% better and mainly when doing heavy lifting.  Still preaches and tolerated critique.  No paranoia.  Mood stable.  No mania.  Cut hours PT at work bc too stressed and that helped.  Patient reports stable mood and denies depressed or irritable moods.   Patient denies difficulty with sleep initiation or maintenance. Sleep 11-1028. Using BiPap. Denies appetite disturbance.  Patient reports that energy and motivation have been good.  Patient denies any difficulty with concentration.  Patient denies any suicidal ideation.  Asks about increasing Wellbutrin to the max to try stop smoking completely.  He is aware of  the risk of it causing more anxiety and paranoia.  He reduced the dose and 2017 and gradually had worsening anxiety and paranoia at 500 mg daily.  Past Psychiatric Medication Trials:  On clozapine 700 since July 2018 to August 2020.  Chantix N and irritable and tired.  Tried twice.  Wellbutrin 300 Haldol dystonia  olanzapine for many years, ziprasidone, risperidone,  benzodiazepine dependence remotely,  modafinil,  propranolol, fluvoxamine,  Aricept no response, Exelon partial response, Nuvigil, Provigil  Review of Systems:  Review of Systems  Constitutional: Negative for fatigue.  Neurological: Negative for tremors and weakness.  Psychiatric/Behavioral: Negative for agitation, behavioral problems, confusion, decreased concentration, dysphoric  mood, hallucinations, self-injury, sleep disturbance and suicidal ideas. The patient is not nervous/anxious and is not hyperactive.   Not usually short of breath.  Medications: I have reviewed the patient's current medications.  Current Outpatient Medications  Medication Sig Dispense Refill  . buPROPion (WELLBUTRIN XL) 300 MG 24 hr tablet TAKE 1 TABLET(300 MG) BY MOUTH DAILY 90 tablet 0  . cloZAPine (CLOZARIL) 100 MG tablet TAKE 7 TABLETS BY MOUTH EVERY NIGHT AT BEDTIME (Patient taking differently: Take 400 mg by mouth daily. ) 210 tablet 3  . lamoTRIgine (LAMICTAL) 150 MG tablet Take 1 tablet by mouth every morning 90 tablet 0  . lithium carbonate (ESKALITH) 450 MG CR tablet TAKE 1 TABLET BY MOUTH EVERY MORNING AND 1 AND 1/2 TABLETS AT NIGHT. (Patient taking differently: Take 450 mg by mouth 2 (two) times daily. ) 75 tablet 2  . modafinil (PROVIGIL) 200 MG tablet Take 1 tablet (200 mg total) by mouth every morning. 30 tablet 5  . propranolol (INDERAL) 20 MG tablet TAKE 1 TO 3 TABLETS BY MOUTH TWICE DAILY 540 tablet 0   No current facility-administered medications for this visit.    Medication Side Effect: fatigue  Allergies: No Known  Allergies  Past Medical History:  Diagnosis Date  . Schizoaffective disorder ([place])     Family History  Problem Relation Age of Onset  . Kidney cancer Mother   . Diabetes Mother   . Hypertension Mother   . Hyperlipidemia Mother   . Hypertension Father     Social History   Socioeconomic History  . Marital status: Married    Spouse name: Not on file  . Number of children: Not on file  . Years of education: Not on file  . Highest education level: Not on file  Occupational History  . 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  Tobacco Use  . Smoking status: Current Every Day Smoker    Packs/day: 1.00    Years: 25.00    Pack years: 25.00    Types: Cigarettes  . Smokeless tobacco: Never Used  Substance and Sexual Activity  . Alcohol use: No  . Drug use: No  . Sexual activity: Not on file  Other Topics Concern  . Not on file  Social History Narrative  . Not on file   Social Determinants of Health   Financial Resource Strain:   . Difficulty of Paying Living Expenses: Not on file  Food Insecurity:   . Worried About Charity fundraiser in the Last Year: Not on file  . Ran Out of Food in the Last Year: Not on file  Transportation Needs:   . Lack of Transportation (Medical): Not on file  . Lack of Transportation (Non-Medical): Not on file  Physical Activity:   . Days of Exercise per Week: Not on file  . Minutes of Exercise per Session: Not on file  Stress:   . Feeling of Stress : Not on file  Social Connections:   . Frequency of Communication with Friends and Family: Not on file  . Frequency of Social Gatherings with Friends and Family: Not on file  . Attends Religious Services: Not on file  . Active Member of Clubs or Organizations: Not on file  . Attends Archivist Meetings: Not on file  . Marital Status: Not on file  Intimate Partner Violence:   . Fear of Current or Ex-Partner: Not on file  . Emotionally Abused: Not on file  . Physically  Abused:  Not on file  . Sexually Abused: Not on file    Past Medical History, Surgical history, Social history, and Family history were reviewed and updated as appropriate.   Please see review of systems for further details on the patient's review from today.   Objective:   Physical Exam:  There were no vitals taken for this visit.  Physical Exam Constitutional:      General: He is not in acute distress.    Appearance: He is well-developed.  Musculoskeletal:        General: No deformity.  Neurological:     Mental Status: He is alert and oriented to person, place, and time.     Motor: No tremor.     Coordination: Coordination normal.     Gait: Gait normal.  Psychiatric:        Attention and Perception: Attention and perception normal. He is attentive.        Mood and Affect: Mood is anxious. Mood is not depressed. Affect is not labile, blunt, angry or inappropriate.        Speech: Speech normal.        Behavior: Behavior normal.        Thought Content: Thought content normal. Thought content is not paranoid. Thought content does not include homicidal or suicidal ideation. Thought content does not include homicidal or suicidal plan.        Cognition and Memory: Cognition normal.        Judgment: Judgment normal.     Comments: Insight is good.     Lab Review:     Component Value Date/Time   NA 141 03/09/2007 2256   K 4.1 03/09/2007 2256   CL 105 03/09/2007 2256   CO2 28 03/09/2007 2256   GLUCOSE 107 (H) 03/09/2007 2256   BUN 7 03/09/2007 2256   CREATININE 0.94 03/09/2007 2256   CALCIUM 9.5 03/09/2007 2256   GFRNONAA >60 03/09/2007 2256   GFRAA  03/09/2007 2256    >60        The eGFR has been calculated using the MDRD equation. This calculation has not been validated in all clinical       Component Value Date/Time   WBC 4.7 03/09/2007 2256   RBC 5.06 03/09/2007 2256   HGB 16.1 03/09/2007 2256   HCT 45.7 03/09/2007 2256   PLT 164 03/09/2007 2256   MCV 90.2  03/09/2007 2256   MCHC 35.3 03/09/2007 2256   RDW 12.6 03/09/2007 2256   LYMPHSABS 2.1 03/09/2007 2256   MONOABS 0.4 03/09/2007 2256   EOSABS 0.0 03/09/2007 2256   BASOSABS 0.1 03/09/2007 2256    No results found for: POCLITH, LITHIUM   Lab Results  Component Value Date   VALPROATE 78.4 03/15/2007     .res Assessment: Plan:    Schizoaffective disorder, bipolar type ([place])  Nicotine dependence with withdrawal, unspecified nicotine product type  Myoclonic jerking  Social anxiety disorder  Mixed obsessional thoughts and acts  Mild cognitive impairment  Lithium use  Generalized anxiety disorder   He failed to tolerate even the lower dose of Chantix.  Though it was helpful.  His schizoaffective disorder symptoms including paranoia and irritability are well controlled with the current medications.  Including the clozapine.  His CBC with differential has been stable and unremarkable.  These have been recorded in the paper chart because it is difficult to send the results to the pharmacy and our [facility] through epic we discussed the side effects of clozapine in detail.    Marland Kitchen  We have tried to mitigate excessive tiredness from clozapine that through the use of modafinil or Nuvigil with partial success. He is having myoclonic jerks in legs likely from clozapine.     We discussed the risk of worsening paranoia if we reduce the dosage.  He is willing to tolerate this risk.  He has mild cognitive impairment is still present.  Stopped rivastigmine bc NR.  His anxiety is manageable at the moment with current medications.  Myoclonic jerks are better with increased lamotrigine to 150 mg daily.  No changes   OK trial increase to Wellbutrin XL 450 mg AM trial for smoking cessation DT failure of Chantix and nicotine patches.  Disc SE in detail.  Discussed the risk that this could increasing's anxiety or cause paranoia or mania or jerks.  Because of the severity of risk of smoking to his  overall health it is reasonable to try this.  Disc DDI clozapine and smoking.  Evidence suggests 40% lower levels in smokers which may require reducing the clozapine as he quits smoking.  He has reduced from 700 to 500 mg in the last week.   Continue clozapine to 400 mg daily.  Disc relapse risk. Call if anxiety or paranoia is worse or if the jerks are not better by November 26 at which point we could increase the lamotrigine if needed  Follow-up 8 weeks  Lynder Parents, MD, DFAPA   No future appointments.  No orders of the defined types were placed in this encounter.     -------------------------------

## 2019-01-28 DIAGNOSIS — F259 Schizoaffective disorder, unspecified: Secondary | ICD-10-CM | POA: Diagnosis not present

## 2019-01-29 ENCOUNTER — Telehealth: Payer: Self-pay

## 2019-01-29 NOTE — Telephone Encounter (Signed)
Prior authorization submitted and approved for Modafinil 200 mg through [employer] Part D (800) [id number]  [place]#[id number] Effective 01/29/2019-07/29/2019  PA# [id number]

## 2019-02-02 ENCOUNTER — Encounter: Payer: Self-pay | Admitting: Psychiatry

## 2019-02-03 ENCOUNTER — Other Ambulatory Visit: Payer: Self-pay | Admitting: Psychiatry

## 2019-02-03 ENCOUNTER — Telehealth: Payer: Self-pay | Admitting: Psychiatry

## 2019-02-03 MED ORDER — CLOZAPINE 100 MG PO TABS: ORAL_TABLET | ORAL | 6 refills | Status: DC

## 2019-02-03 NOTE — Telephone Encounter (Signed)
Prescription sent

## 2019-02-03 NOTE — Telephone Encounter (Signed)
Please send Rx to [facility] in  [place].

## 2019-02-03 NOTE — Telephone Encounter (Signed)
ANC was normal and lab given to staff to fax to [facility]

## 2019-02-03 NOTE — Telephone Encounter (Signed)
Pt called checking status on labs and requesting refill for Clozapine 100 mg.

## 2019-02-17 ENCOUNTER — Telehealth: Payer: Self-pay | Admitting: Psychiatry

## 2019-02-17 NOTE — Telephone Encounter (Signed)
Pt would like to increase his Lamotrgine. Pt having problems with his leg twitching. Please send to [facility] in [place]

## 2019-02-17 NOTE — Telephone Encounter (Signed)
Okay to increase lamotrigine to either 150 mg tablets one half in the morning and 1 at bedtime or if he is not near the end of his prescription for 150 mg tablets then send in prescription for 100 mg twice daily

## 2019-02-18 NOTE — Telephone Encounter (Signed)
Spoke with patient and given instructions to increase dose, Lamictal 150 mg take 1/2 tablet in the morning. He said he will use what he has at home then call when he's running low.

## 2019-02-22 ENCOUNTER — Other Ambulatory Visit: Payer: Self-pay | Admitting: Psychiatry

## 2019-02-22 DIAGNOSIS — F17203 Nicotine dependence unspecified, with withdrawal: Secondary | ICD-10-CM

## 2019-03-05 ENCOUNTER — Other Ambulatory Visit: Payer: Self-pay | Admitting: Psychiatry

## 2019-03-09 DIAGNOSIS — Z79899 Other long term (current) drug therapy: Secondary | ICD-10-CM | POA: Diagnosis not present

## 2019-03-11 ENCOUNTER — Other Ambulatory Visit: Payer: Self-pay

## 2019-03-11 ENCOUNTER — Telehealth: Payer: Self-pay | Admitting: Psychiatry

## 2019-03-11 ENCOUNTER — Encounter: Payer: Self-pay | Admitting: Psychiatry

## 2019-03-11 MED ORDER — LAMOTRIGINE 150 MG PO TABS: ORAL_TABLET | ORAL | 0 refills | Status: DC

## 2019-03-11 NOTE — Telephone Encounter (Signed)
RX refill for lamictal 150 mg take 1.5 tablets daily #135 submitted to [facility]

## 2019-03-11 NOTE — Telephone Encounter (Signed)
Pt will need Lamotrigine RF however he is at a new dose.  Takes 150mg   1 1/2 day Please send in to pharmacy on file.

## 2019-03-12 ENCOUNTER — Other Ambulatory Visit: Payer: Self-pay | Admitting: Psychiatry

## 2019-03-18 ENCOUNTER — Ambulatory Visit (INDEPENDENT_AMBULATORY_CARE_PROVIDER_SITE_OTHER): Payer: Medicare Other | Admitting: Psychiatry

## 2019-03-18 ENCOUNTER — Encounter: Payer: Self-pay | Admitting: Psychiatry

## 2019-03-18 ENCOUNTER — Other Ambulatory Visit: Payer: Self-pay

## 2019-03-18 DIAGNOSIS — G253 Myoclonus: Secondary | ICD-10-CM

## 2019-03-18 MED ORDER — LAMOTRIGINE 150 MG PO TABS: 300.0000 mg | ORAL_TABLET | Freq: Every day | ORAL | 1 refills | Status: DC

## 2019-03-18 NOTE — Progress Notes (Signed)
Matthew Reed [id number] 1975-02-15 44 y.o.    Subjective:   Patient ID:  Matthew Reed is a 44 y.o. (DOB 1975/02/28) male.  Chief Complaint:  Chief Complaint  Patient presents with  . Anxiety  . Follow-up    Paranoia and med management  . Paranoid  . Nicotine Dependence     HPI  Matthew Reed presents to the office today for follow-up of smoking and schizoaffective disorder, MCI and Se meds.  seen August 2020.  He wanted to have a trial of Wellbutrin for smoking cessation that was initiated 150 mg in the morning to then increase to 300 mg, the usual dose.  Patient called October 5 with the following complaint:Pt is having pretty bad side effects he thinks its from the Lithium he is on. Pt's leg start twitching then he falls down. He said its been going on for awhile, but has got worse.  He was given the following response from our office: Patient's complaints are most consistent with myoclonic jerks.  These can occur with lithium.  They can also occur with clozapine.  They are more common when clozapine dosages are above 600 mg daily and the patient is taking 700 mg daily. Serum lithium level was 1.0.  Serum BMP is within normal limits including creatinine 1.26 calcium 9.1.  TSH is also normal at 3.1 His lithium level is not high.  Usually lithium related jerks do not lead to falls.  It is more likely this is related to clozapine.  The treatment for clozapine related jerks is lamotrigine. The last note from the patient states that he has already reduced the lithium by half tablet daily.  If he has done that for over a week then the lithium is not the cause and we need to start the lamotrigine.  We cannot reduce the lithium further.  It typically takes about 1 week for reduction in lithium dosage to lead to reduction in side effects.  If it has been 1 full week then it is necessary to start the low-dose lamotrigine as noted below.  If it has been less than 1 week he can wait until  it has been 1 week and then notify us. Because it is a Friday I will go ahead and send in the prescription for lamotrigine and he can choose to start it now or defer until he has been on the lower dose of lithium for 1 full week.  Instructions on lamotrigine dosing will be on the prescription.  TC Reduced lithium to 2 daily about 5 days and the jerks are improved.  [place] a couple of times.  Better now.   Has reduced the clozapine from 700 to 600 mg daily. He's reduced his smoking by 1/2 or more.  This will tend to increase the blood clozapine. If the sx continue mid next week then call and may require checking blood level of clozapine or adding lamotrigine.  Wellbutrin has helped reduce the smoking. There are several issues that could be going on here.  Both lithium and clozapine can clear trigger myoclonic jerks.  He is already reduced the lithium.  He is also reduced to clozapine but he is also reduce smoking which will tend to increase the clozapine blood level.  Clozapine levels above 600 mg a day can trigger myoclonic jerks and potentially dose-related seizures.  Often lamotrigine is used to counteract this problem.  However there have been so many changes lately that it seems prudent to wait until  next week once the lithium levels are more stable in order to better evaluate the situation.  If the jerks continue then we will probably check a blood level of clozapine and also add lamotrigine. If jerks continue then call next week.  He agrees to this plan.  His lithium level in October before he reduced lithium was 1.0 Since that appointment he has reduced both lithium and clozapine because of jerks.   seen December 04, 2018 and the following was discussed. He just increased the lamotrigine to 100 early November and hasn't seen changes with the jerks.  They are scary bc have caused falls 3-4 times since here but last time was bc tripped.  Legs and arms will jerk.  Less tremor lately with  hands.  Legs will get week and shake and not feel strong enough to carry him.  If he tries to lift something over 20# it will happen.  No unusual back problems.  Hx sciatica resolved. Reduced smoking from 40 to 10 daily with Wellbutrin 150 helping.    OK trial Wellbutrin XL  300 mg AM retrial for smoking cessation DT failure of Chantix and nicotine patches.  Disc SE in detail.  Discussed the risk that this could increasing's anxiety or cause paranoia or mania.  Because of the severity of risk of smoking to his overall health it is reasonable to try this. Disc DDI clozapine and smoking.  Evidence suggests 40% lower levels in smokers which may require reducing the clozapine as he quits smoking.  He has reduced from 700 to 500 mg in the last week.   OK reduce clozapine to 400 mg daily.  Disc relapse risk.  He called December 1 reporting the leg jerks were better but not gone and wanted an increase to low lamotrigine to try to help with this problem.  It was increased from 100 to 150 mg daily at that time.  Last seen January 20, 2019. No problems are worse with reduction in clozapine to 400 mg and sleepiness is better.  Still tends to worry over things too much.  Paranoia still controlled. No mood swings.   Cigarettes 10-12 daily.  Wellbutrin helped him cut back.  Wants to stop. Jerks are 80-90% better and mainly when doing heavy lifting. He wanted to increase the Wellbutrin XL to 450 mg daily to see if he could completely stop smoking and that dose change was made.  Started having more paranoia and increased clozapine to 500 mg and the paranoia resolved. Forgot to increase Wellbutrin and still smoking 7-10 days.  Working PT and it's good.  Just a few hours weekly.  Gets fatigued if does it too much. Jerks are 80% better and asks about increasing lamotrigine from 225 mg AM.  Still preaches and tolerated critique.  No paranoia.  Mood stable.  No mania.  Cut hours PT at work bc too stressed and that  helped.  Patient reports stable mood and denies depressed or irritable moods.   Patient denies difficulty with sleep initiation or maintenance. Sleep 11-1028. Using BiPap. Denies appetite disturbance.  Patient reports that energy and motivation have been good.  Patient denies any difficulty with concentration.  Patient denies any suicidal ideation.  Asks about increasing Wellbutrin to the max to try stop smoking completely.  He is aware of the risk of it causing more anxiety and paranoia.  He reduced the dose and 2017 and gradually had worsening anxiety and paranoia at 500 mg daily.  Past Psychiatric Medication  Trials:  On clozapine 700 since July 2018 to August 2020.  Chantix N and irritable and tired.  Tried twice.  Wellbutrin 300 Haldol dystonia  olanzapine for many years, ziprasidone, risperidone,  benzodiazepine dependence remotely,  modafinil,  propranolol, fluvoxamine,  Aricept no response, Exelon partial response, Nuvigil, Provigil  Review of Systems:  Review of Systems  Constitutional: Negative for fatigue.  Neurological: Negative for tremors and weakness.       Myoclonic jerks not evident in the office but reported.  Psychiatric/Behavioral: Negative for agitation, behavioral problems, confusion, decreased concentration, dysphoric mood, hallucinations, self-injury, sleep disturbance and suicidal ideas. The patient is not nervous/anxious and is not hyperactive.   Not usually short of breath.  Medications: I have reviewed the patient's current medications.  Current Outpatient Medications  Medication Sig Dispense Refill  . buPROPion (WELLBUTRIN XL) 150 MG 24 hr tablet Take 3 tablets (450 mg total) by mouth daily. (Patient taking differently: Take 300 mg by mouth daily. ) 90 tablet 1  . cloZAPine (CLOZARIL) 100 MG tablet TAKE 7 TABLETS BY MOUTH EVERY NIGHT AT BEDTIME (Patient taking differently: 5 tablets daily) 210 tablet 6  . lamoTRIgine (LAMICTAL) 150 MG tablet Take 2 tablets (300  mg total) by mouth daily. Take 1.5 tablets by mouth every morning 180 tablet 1  . lithium carbonate (ESKALITH) 450 MG CR tablet Take 1 tablet (450 mg total) by mouth 2 (two) times daily. 60 tablet 2  . modafinil (PROVIGIL) 200 MG tablet Take 1 tablet (200 mg total) by mouth every morning. 30 tablet 5  . propranolol (INDERAL) 20 MG tablet TAKE 1 TO 3 TABLETS BY MOUTH TWICE DAILY 540 tablet 0   No current facility-administered medications for this visit.    Medication Side Effect: fatigue  Allergies: No Known Allergies  Past Medical History:  Diagnosis Date  . Schizoaffective disorder ([place])     Family History  Problem Relation Age of Onset  . Kidney cancer Mother   . Diabetes Mother   . Hypertension Mother   . Hyperlipidemia Mother   . Hypertension Father     Social History   Socioeconomic History  . Marital status: Married    Spouse name: Not on file  . Number of children: Not on file  . Years of education: Not on file  . Highest education level: Not on file  Occupational History  . 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  Tobacco Use  . Smoking status: Current Every Day Smoker    Packs/day: 1.00    Years: 25.00    Pack years: 25.00    Types: Cigarettes  . Smokeless tobacco: Never Used  Substance and Sexual Activity  . Alcohol use: No  . Drug use: No  . Sexual activity: Not on file  Other Topics Concern  . Not on file  Social History Narrative  . Not on file   Social Determinants of Health   Financial Resource Strain:   . Difficulty of Paying Living Expenses: Not on file  Food Insecurity:   . Worried About Charity fundraiser in the Last Year: Not on file  . Ran Out of Food in the Last Year: Not on file  Transportation Needs:   . Lack of Transportation (Medical): Not on file  . Lack of Transportation (Non-Medical): Not on file  Physical Activity:   . Days of Exercise per Week: Not on file  . Minutes of Exercise per Session: Not on file   Stress:   .  Feeling of Stress : Not on file  Social Connections:   . Frequency of Communication with Friends and Family: Not on file  . Frequency of Social Gatherings with Friends and Family: Not on file  . Attends Religious Services: Not on file  . Active Member of Clubs or Organizations: Not on file  . Attends Archivist Meetings: Not on file  . Marital Status: Not on file  Intimate Partner Violence:   . Fear of Current or Ex-Partner: Not on file  . Emotionally Abused: Not on file  . Physically Abused: Not on file  . Sexually Abused: Not on file    Past Medical History, Surgical history, Social history, and Family history were reviewed and updated as appropriate.   Please see review of systems for further details on the patient's review from today.   Objective:   Physical Exam:  There were no vitals taken for this visit.  Physical Exam Constitutional:      General: He is not in acute distress.    Appearance: He is well-developed.  Musculoskeletal:        General: No deformity.  Neurological:     Mental Status: He is alert and oriented to person, place, and time.     Motor: No tremor.     Coordination: Coordination normal.     Gait: Gait normal.  Psychiatric:        Attention and Perception: Attention and perception normal. He is attentive.        Mood and Affect: Mood is anxious. Mood is not depressed. Affect is not labile, blunt, angry or inappropriate.        Speech: Speech normal.        Behavior: Behavior normal.        Thought Content: Thought content normal. Thought content is not paranoid. Thought content does not include homicidal or suicidal ideation. Thought content does not include homicidal or suicidal plan.        Cognition and Memory: Cognition normal.        Judgment: Judgment normal.     Comments: Insight is good. Had some paranoia since he was here but resolved with the increase in clozapine to 500 mg.     Lab Review:     Component  Value Date/Time   NA 141 03/09/2007 2256   K 4.1 03/09/2007 2256   CL 105 03/09/2007 2256   CO2 28 03/09/2007 2256   GLUCOSE 107 (H) 03/09/2007 2256   BUN 7 03/09/2007 2256   CREATININE 0.94 03/09/2007 2256   CALCIUM 9.5 03/09/2007 2256   GFRNONAA >60 03/09/2007 2256   GFRAA  03/09/2007 2256    >60        The eGFR has been calculated using the MDRD equation. This calculation has not been validated in all clinical       Component Value Date/Time   WBC 4.7 03/09/2007 2256   RBC 5.06 03/09/2007 2256   HGB 16.1 03/09/2007 2256   HCT 45.7 03/09/2007 2256   PLT 164 03/09/2007 2256   MCV 90.2 03/09/2007 2256   MCHC 35.3 03/09/2007 2256   RDW 12.6 03/09/2007 2256   LYMPHSABS 2.1 03/09/2007 2256   MONOABS 0.4 03/09/2007 2256   EOSABS 0.0 03/09/2007 2256   BASOSABS 0.1 03/09/2007 2256    No results found for: POCLITH, LITHIUM   Lab Results  Component Value Date   VALPROATE 78.4 03/15/2007     .res Assessment: Plan:    Myoclonic jerking - Plan:  lamoTRIgine (LAMICTAL) 150 MG tablet   He failed to tolerate even the lower dose of Chantix.  Though it was helpful.  His schizoaffective disorder symptoms including paranoia and irritability are well controlled with the current medications.  Including the clozapine.  His CBC with differential has been stable and unremarkable.  These have been recorded in the paper chart because it is difficult to send the results to the pharmacy and our REMS through epic we discussed the side effects of clozapine in detail.    .  We have tried to mitigate excessive tiredness from clozapine that through the use of modafinil or Nuvigil with partial success. He is having myoclonic jerks in legs likely from clozapine.     We discussed the risk of worsening paranoia if we reduce the dosage.  He is willing to tolerate this risk.  He has mild cognitive impairment is still present.  Stopped rivastigmine bc NR.  His anxiety is manageable at the moment with  current medications.  Myoclonic jerks are better but not gone and he wants trial of higher dose.  Increase to 300 mg daily.  No changes For the jerks increase lamotrigine to 2 tablets in the morning.  ANC levels are within normal limits.  Options for labs [facility] or [facility] He and this office are having difficulties dealing with his current lab which is an independent laboratory that does not send results in epic.  Increase Wellbutrin to 3 of the 150 mg tablets each morning. He forgot to do this last visit. trial for smoking cessation DT failure of Chantix and nicotine patches.  Disc SE in detail.  Discussed the risk that this could increasing's anxiety or cause paranoia or mania or jerks.  Because of the severity of risk of smoking to his overall health it is reasonable to try this.  Disc DDI clozapine and smoking.  Evidence suggests 40% lower levels in smokers which may require reducing the clozapine as he quits smoking.  He has reduced from 700 to 500 mg in the last few months Continue clozapine to 500 mg daily.  Disc relapse risk.  His paranoia had worsened at 400 mg daily.  It resolved again when he increase the dose back to 500 mg daily.  Follow-up 3 mos  Lynder Parents, MD, DFAPA   Future Appointments  Date Time Provider [place]  06/17/2019  2:30 PM Cottle, Billey Co., MD [facility] None    No orders of the defined types were placed in this encounter.     -------------------------------

## 2019-03-18 NOTE — Patient Instructions (Signed)
Options for labs [facility] or [facility]  Increase Wellbutrin to 3 of the 150 mg tablets each morning  For the jerks increase lamotrigine to 2 tablets in the morning.

## 2019-03-19 ENCOUNTER — Other Ambulatory Visit: Payer: Self-pay

## 2019-03-19 DIAGNOSIS — G253 Myoclonus: Secondary | ICD-10-CM

## 2019-03-19 MED ORDER — LAMOTRIGINE 150 MG PO TABS: 300.0000 mg | ORAL_TABLET | Freq: Every day | ORAL | 1 refills | Status: DC

## 2019-03-20 ENCOUNTER — Encounter: Payer: Self-pay | Admitting: Psychiatry

## 2019-03-24 ENCOUNTER — Telehealth: Payer: Self-pay | Admitting: Psychiatry

## 2019-03-24 NOTE — Telephone Encounter (Signed)
Called to give clarification but they did find the correct order dated 03/19/2019

## 2019-03-24 NOTE — Telephone Encounter (Signed)
[facility] left v-mail. Need you to clarify directions and dose for Lamotraigine sent on 3/3. [phone]

## 2019-04-01 ENCOUNTER — Other Ambulatory Visit: Payer: Self-pay | Admitting: Psychiatry

## 2019-04-01 DIAGNOSIS — F17203 Nicotine dependence unspecified, with withdrawal: Secondary | ICD-10-CM

## 2019-04-20 DIAGNOSIS — Z79899 Other long term (current) drug therapy: Secondary | ICD-10-CM | POA: Diagnosis not present

## 2019-05-21 DIAGNOSIS — Z79899 Other long term (current) drug therapy: Secondary | ICD-10-CM | POA: Diagnosis not present

## 2019-06-07 ENCOUNTER — Other Ambulatory Visit: Payer: Self-pay | Admitting: Psychiatry

## 2019-06-17 ENCOUNTER — Ambulatory Visit (INDEPENDENT_AMBULATORY_CARE_PROVIDER_SITE_OTHER): Payer: Medicare Other | Admitting: Psychiatry

## 2019-06-17 ENCOUNTER — Other Ambulatory Visit: Payer: Self-pay

## 2019-06-17 ENCOUNTER — Other Ambulatory Visit: Payer: Self-pay | Admitting: Psychiatry

## 2019-06-17 ENCOUNTER — Encounter: Payer: Self-pay | Admitting: Psychiatry

## 2019-06-17 DIAGNOSIS — Z79899 Other long term (current) drug therapy: Secondary | ICD-10-CM | POA: Diagnosis not present

## 2019-06-17 DIAGNOSIS — F4010 Social phobia, unspecified: Secondary | ICD-10-CM | POA: Diagnosis not present

## 2019-06-17 DIAGNOSIS — F411 Generalized anxiety disorder: Secondary | ICD-10-CM | POA: Diagnosis not present

## 2019-06-17 DIAGNOSIS — F422 Mixed obsessional thoughts and acts: Secondary | ICD-10-CM

## 2019-06-17 DIAGNOSIS — F250 Schizoaffective disorder, bipolar type: Secondary | ICD-10-CM | POA: Diagnosis not present

## 2019-06-17 DIAGNOSIS — G253 Myoclonus: Secondary | ICD-10-CM | POA: Diagnosis not present

## 2019-06-17 DIAGNOSIS — F17203 Nicotine dependence unspecified, with withdrawal: Secondary | ICD-10-CM | POA: Diagnosis not present

## 2019-06-17 NOTE — Progress Notes (Signed)
DINESH ULYSSE [id number] 11/05/1975 44 y.o.    Subjective:   Patient ID:  Matthew Reed is a 44 y.o. (DOB 09-24-1975) male.  Chief Complaint:  No chief complaint on file.    HPI  JEDRICK HUTCHERSON presents to the office today for follow-up of smoking and schizoaffective disorder, MCI and Se meds.  seen August 2020.  He wanted to have a trial of Wellbutrin for smoking cessation that was initiated 150 mg in the morning to then increase to 300 mg, the usual dose.  Patient called October 5 with the following complaint:Pt is having pretty bad side effects he thinks its from the Lithium he is on. Pt's leg start twitching then he falls down. He said its been going on for awhile, but has got worse.  He was given the following response from our office: Patient's complaints are most consistent with myoclonic jerks.  These can occur with lithium.  They can also occur with clozapine.  They are more common when clozapine dosages are above 600 mg daily and the patient is taking 700 mg daily. Serum lithium level was 1.0.  Serum BMP is within normal limits including creatinine 1.26 calcium 9.1.  TSH is also normal at 3.1 His lithium level is not high.  Usually lithium related jerks do not lead to falls.  It is more likely this is related to clozapine.  The treatment for clozapine related jerks is lamotrigine. The last note from the patient states that he has already reduced the lithium by half tablet daily.  If he has done that for over a week then the lithium is not the cause and we need to start the lamotrigine.  We cannot reduce the lithium further.  It typically takes about 1 week for reduction in lithium dosage to lead to reduction in side effects.  If it has been 1 full week then it is necessary to start the low-dose lamotrigine as noted below.  If it has been less than 1 week he can wait until it has been 1 week and then notify us. Because it is a Friday I will go ahead and send in the prescription  for lamotrigine and he can choose to start it now or defer until he has been on the lower dose of lithium for 1 full week.  Instructions on lamotrigine dosing will be on the prescription.  TC Reduced lithium to 2 daily about 5 days and the jerks are improved.  [place] a couple of times.  Better now.   Has reduced the clozapine from 700 to 600 mg daily. He's reduced his smoking by 1/2 or more.  This will tend to increase the blood clozapine. If the sx continue mid next week then call and may require checking blood level of clozapine or adding lamotrigine.  Wellbutrin has helped reduce the smoking. There are several issues that could be going on here.  Both lithium and clozapine can clear trigger myoclonic jerks.  He is already reduced the lithium.  He is also reduced to clozapine but he is also reduce smoking which will tend to increase the clozapine blood level.  Clozapine levels above 600 mg a day can trigger myoclonic jerks and potentially dose-related seizures.  Often lamotrigine is used to counteract this problem.  However there have been so many changes lately that it seems prudent to wait until next week once the lithium levels are more stable in order to better evaluate the situation.  If the jerks continue then  we will probably check a blood level of clozapine and also add lamotrigine. If jerks continue then call next week.  He agrees to this plan.  His lithium level in October before he reduced lithium was 1.0 Since that appointment he has reduced both lithium and clozapine because of jerks.   seen December 04, 2018 and the following was discussed. He just increased the lamotrigine to 100 early November and hasn't seen changes with the jerks.  They are scary bc have caused falls 3-4 times since here but last time was bc tripped.  Legs and arms will jerk.  Less tremor lately with hands.  Legs will get week and shake and not feel strong enough to carry him.  If he tries to lift something  over 20# it will happen.  No unusual back problems.  Hx sciatica resolved. Reduced smoking from 40 to 10 daily with Wellbutrin 150 helping.    OK trial Wellbutrin XL  300 mg AM retrial for smoking cessation DT failure of Chantix and nicotine patches.  Disc SE in detail.  Discussed the risk that this could increasing's anxiety or cause paranoia or mania.  Because of the severity of risk of smoking to his overall health it is reasonable to try this. Disc DDI clozapine and smoking.  Evidence suggests 40% lower levels in smokers which may require reducing the clozapine as he quits smoking.  He has reduced from 700 to 500 mg in the last week.   OK reduce clozapine to 400 mg daily.  Disc relapse risk.  He called December 1 reporting the leg jerks were better but not gone and wanted an increase to low lamotrigine to try to help with this problem.  It was increased from 100 to 150 mg daily at that time.  seen January 20, 2019. No problems are worse with reduction in clozapine to 400 mg and sleepiness is better.  Still tends to worry over things too much.  Paranoia still controlled. No mood swings.   Cigarettes 10-12 daily.  Wellbutrin helped him cut back.  Wants to stop. Jerks are 80-90% better and mainly when doing heavy lifting. He wanted to increase the Wellbutrin XL to 450 mg daily to see if he could completely stop smoking and that dose change was made.  03/18/2019 appointment the following is noted: Started having more paranoia and increased clozapine to 500 mg and the paranoia resolved. Forgot to increase Wellbutrin and still smoking 7-10 days.  Working PT and it's good.  Just a few hours weekly.  Gets fatigued if does it too much. Jerks are 80% better and asks about increasing lamotrigine from 225 mg AM. Still preaches and tolerated critique.  No paranoia.  Mood stable.  No mania.  Cut hours PT at work bc too stressed and that helped.  Patient reports stable mood and denies depressed or irritable  moods.   Patient denies difficulty with sleep initiation or maintenance. Sleep 11-1028. Using BiPap. Denies appetite disturbance.  Patient reports that energy and motivation have been good.  Patient denies any difficulty with concentration.  Patient denies any suicidal ideation. Asks about increasing Wellbutrin to the max to try stop smoking completely.  He is aware of the risk of it causing more anxiety and paranoia.   Plan: Increase Wellbutrin XL to 450 mg daily to maximize benefit for smoking cessation. Increase lamotrigine to 2 daily to help with jerks.  06/17/2019 appointment the following is noted: Took Wellbutrin 450 mg for awhile and was cranky,  moody and paranoid and stopped and it resolved off of it. It did help with smoking.  Increased smoking caused increased paranoia and needed to increase clozapine to 600 mg daily from 500 mg daily. Some PT work stressed dealing with the public but usually can handle it if not around people so much. Sleep OK with bipap with good reading.  Machine is 45 years old. Still occ speaks at church. Jerking is a lot better but it still happens.  No falls. Propranolol helps anxiety and tremor.  He reduced the clozapine dose and 2017 and gradually had worsening anxiety and paranoia at 500 mg daily.  Past Psychiatric Medication Trials:  On clozapine 700 since July 2018 to August 2020.  Chantix N and irritable and tired.  Tried twice.  Wellbutrin 300 Haldol dystonia  olanzapine for many years, ziprasidone, risperidone,  benzodiazepine dependence remotely,  modafinil,  propranolol, fluvoxamine,  Aricept no response, Exelon partial response, Nuvigil, Provigil  Review of Systems:  Review of Systems  Constitutional: Negative for fatigue.  Neurological: Negative for tremors and weakness.       Myoclonic jerks not evident in the office but reported.  Psychiatric/Behavioral: Negative for agitation, behavioral problems, confusion, decreased concentration,  dysphoric mood, hallucinations, self-injury, sleep disturbance and suicidal ideas. The patient is not nervous/anxious and is not hyperactive.   Not usually short of breath.  Medications: I have reviewed the patient's current medications.  Current Outpatient Medications  Medication Sig Dispense Refill  . cloZAPine (CLOZARIL) 100 MG tablet TAKE 7 TABLETS BY MOUTH EVERY NIGHT AT BEDTIME (Patient taking differently: Take 600 mg by mouth daily. ) 210 tablet 6  . lamoTRIgine (LAMICTAL) 150 MG tablet Take 2 tablets (300 mg total) by mouth daily. 180 tablet 1  . lithium carbonate (ESKALITH) 450 MG CR tablet Take 1 tablet (450 mg total) by mouth 2 (two) times daily. 60 tablet 2  . modafinil (PROVIGIL) 200 MG tablet Take 1 tablet (200 mg total) by mouth every morning. 30 tablet 5  . propranolol (INDERAL) 20 MG tablet TAKE 1 TO 3 TABLETS BY MOUTH TWICE DAILY 540 tablet 0   No current facility-administered medications for this visit.    Medication Side Effect: fatigue  Allergies: No Known Allergies  Past Medical History:  Diagnosis Date  . Schizoaffective disorder ([place]abel[place])     Family History  Problem Relation Age of Onset  . Kidney cancer Mother   . Diabetes Mother   . Hypertension Mother   . Hyperlipidemia Mother   . Hypertension Father     Social History   Socioeconomic History  . Marital status: Married    Spouse name: Not on file  . Number of children: Not on file  . Years of education: Not on file  . Highest education level: Not on file  Occupational History  . 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  Tobacco Use  . Smoking status: Current Every Day Smoker    Packs/day: 1.00    Years: 25.00    Pack years: 25.00    Types: Cigarettes  . Smokeless tobacco: Never Used  Substance and Sexual Activity  . Alcohol use: No  . Drug use: No  . Sexual activity: Not on file  Other Topics Concern  . Not on file  Social History Narrative  . Not on file   Social  Determinants of Health   Financial Resource Strain:   . Difficulty of Paying Living Expenses:   Food Insecurity:   . Worried  About Running Out of Food in the Last Year:   . [place] in the Last Year:   Transportation Needs:   . Lack of Transportation (Medical):   Marland Kitchen Lack of Transportation (Non-Medical):   Physical Activity:   . Days of Exercise per Week:   . Minutes of Exercise per Session:   Stress:   . Feeling of Stress :   Social Connections:   . Frequency of Communication with Friends and Family:   . Frequency of Social Gatherings with Friends and Family:   . Attends Religious Services:   . Active Member of Clubs or Organizations:   . Attends Archivist Meetings:   Marland Kitchen Marital Status:   Intimate Partner Violence:   . Fear of Current or Ex-Partner:   . Emotionally Abused:   Marland Kitchen Physically Abused:   . Sexually Abused:     Past Medical History, Surgical history, Social history, and Family history were reviewed and updated as appropriate.   Please see review of systems for further details on the patient's review from today.   Objective:   Physical Exam:  There were no vitals taken for this visit.  Physical Exam Constitutional:      General: He is not in acute distress.    Appearance: He is well-developed.  Musculoskeletal:        General: No deformity.  Neurological:     Mental Status: He is alert and oriented to person, place, and time.     Motor: No tremor.     Coordination: Coordination normal.     Gait: Gait normal.  Psychiatric:        Attention and Perception: Attention and perception normal. He is attentive.        Mood and Affect: Mood is anxious. Mood is not depressed. Affect is not labile, blunt, angry or inappropriate.        Speech: Speech normal.        Behavior: Behavior normal.        Thought Content: Thought content normal. Thought content is not paranoid. Thought content does not include homicidal or suicidal ideation. Thought content  does not include homicidal or suicidal plan.        Cognition and Memory: Cognition normal.        Judgment: Judgment normal.     Comments: Insight is good. Had some paranoia since he was here but resolved with the increase in clozapine to 500 mg.     Lab Review:     Component Value Date/Time   NA 141 03/09/2007 2256   K 4.1 03/09/2007 2256   CL 105 03/09/2007 2256   CO2 28 03/09/2007 2256   GLUCOSE 107 (H) 03/09/2007 2256   BUN 7 03/09/2007 2256   CREATININE 0.94 03/09/2007 2256   CALCIUM 9.5 03/09/2007 2256   GFRNONAA >60 03/09/2007 2256   GFRAA  03/09/2007 2256    >60        The eGFR has been calculated using the MDRD equation. This calculation has not been validated in all clinical       Component Value Date/Time   WBC 4.7 03/09/2007 2256   RBC 5.06 03/09/2007 2256   HGB 16.1 03/09/2007 2256   HCT 45.7 03/09/2007 2256   PLT 164 03/09/2007 2256   MCV 90.2 03/09/2007 2256   MCHC 35.3 03/09/2007 2256   RDW 12.6 03/09/2007 2256   LYMPHSABS 2.1 03/09/2007 2256   MONOABS 0.4 03/09/2007 2256   EOSABS 0.0 03/09/2007 2256  BASOSABS 0.1 03/09/2007 2256    No results found for: POCLITH, LITHIUM   Lab Results  Component Value Date   VALPROATE 78.4 03/15/2007     .res Assessment: Plan:    Schizoaffective disorder, bipolar type ([place]) - Plan: Lithium level  Myoclonic jerking  Nicotine dependence with withdrawal, unspecified nicotine product type  Social anxiety disorder  Mixed obsessional thoughts and acts  Lithium use - Plan: Lithium level  Generalized anxiety disorder   He failed to tolerate even the lower dose of Chantix.  Though it was helpful.  His schizoaffective disorder symptoms including paranoia and irritability are well controlled with the current medications.  Including the clozapine.  His CBC with differential has been stable and unremarkable.  These have been recorded in the paper chart because it is difficult to send the results to the  pharmacy and our REMS through epic we discussed the side effects of clozapine in detail.    .  We have tried to mitigate excessive tiredness from clozapine that through the use of modafinil or Nuvigil with partial success. He is having myoclonic jerks in legs likely from clozapine.     We discussed the risk of worsening paranoia if we reduce the dosage.  He is willing to tolerate this risk.  He has mild cognitive impairment is still present.  Stopped rivastigmine bc NR.  His anxiety is manageable at the moment with current medications.  Myoclonic jerks are better but not gone and he wants trial of higher dose.  Increase to 300 mg daily.  No changes For the jerks increase lamotrigine to 2 tablets in the morning.  ANC levels are within normal limits.  Options for labs [facility] or [facility] He and this office are having difficulties dealing with his current lab which is an independent laboratory that does not send results in epic.  Increase Wellbutrin to 3 of the 150 mg tablets each morning. He forgot to do this last visit. trial for smoking cessation DT failure of Chantix and nicotine patches.  Disc SE in detail.  Discussed the risk that this could increasing's anxiety or cause paranoia or mania or jerks.  Because of the severity of risk of smoking to his overall health it is reasonable to try this.  Disc DDI clozapine and smoking.  Evidence suggests 40% lower levels in smokers which may require reducing the clozapine as he quits smoking.  He has reduced from 700 to 500 mg in the last few months Continue clozapine to 500 mg daily.  Disc relapse risk.  His paranoia had worsened at 400 mg daily.  It resolved again when he increase the dose back to 500 mg daily.  Check lithium level  Follow-up 4 mos  Lynder Parents, MD, DFAPA   No future appointments.  Orders Placed This Encounter  Procedures  . Lithium level      -------------------------------

## 2019-06-17 NOTE — Telephone Encounter (Signed)
Has apt this afternoon 

## 2019-06-24 DIAGNOSIS — Z79899 Other long term (current) drug therapy: Secondary | ICD-10-CM | POA: Diagnosis not present

## 2019-06-24 DIAGNOSIS — F259 Schizoaffective disorder, unspecified: Secondary | ICD-10-CM | POA: Diagnosis not present

## 2019-06-29 ENCOUNTER — Other Ambulatory Visit: Payer: Self-pay | Admitting: Psychiatry

## 2019-06-29 NOTE — Telephone Encounter (Signed)
Due back 10/06

## 2019-07-08 ENCOUNTER — Other Ambulatory Visit: Payer: Self-pay | Admitting: Psychiatry

## 2019-07-08 DIAGNOSIS — F17203 Nicotine dependence unspecified, with withdrawal: Secondary | ICD-10-CM

## 2019-07-31 ENCOUNTER — Encounter: Payer: Self-pay | Admitting: Psychiatry

## 2019-08-07 ENCOUNTER — Telehealth: Payer: Self-pay

## 2019-08-07 NOTE — Telephone Encounter (Signed)
Renewal prior authorization for MODAFINIL 200 MG #30 submitted and approved through [employer] Part D ID# [id number], effective 08/05/2019-02/06/2020  Patient uses [facility] [place], [place]

## 2019-09-02 DIAGNOSIS — Z79899 Other long term (current) drug therapy: Secondary | ICD-10-CM | POA: Diagnosis not present

## 2019-09-02 DIAGNOSIS — F259 Schizoaffective disorder, unspecified: Secondary | ICD-10-CM | POA: Diagnosis not present

## 2019-09-03 ENCOUNTER — Encounter: Payer: Self-pay | Admitting: Psychiatry

## 2019-09-06 ENCOUNTER — Other Ambulatory Visit: Payer: Self-pay | Admitting: Psychiatry

## 2019-09-20 ENCOUNTER — Other Ambulatory Visit: Payer: Self-pay | Admitting: Psychiatry

## 2019-10-06 DIAGNOSIS — F259 Schizoaffective disorder, unspecified: Secondary | ICD-10-CM | POA: Diagnosis not present

## 2019-10-21 ENCOUNTER — Encounter: Payer: Self-pay | Admitting: Psychiatry

## 2019-10-21 ENCOUNTER — Other Ambulatory Visit: Payer: Self-pay

## 2019-10-21 ENCOUNTER — Ambulatory Visit (INDEPENDENT_AMBULATORY_CARE_PROVIDER_SITE_OTHER): Payer: Medicare Other | Admitting: Psychiatry

## 2019-10-21 DIAGNOSIS — F17203 Nicotine dependence unspecified, with withdrawal: Secondary | ICD-10-CM

## 2019-10-21 DIAGNOSIS — F411 Generalized anxiety disorder: Secondary | ICD-10-CM

## 2019-10-21 DIAGNOSIS — G4733 Obstructive sleep apnea (adult) (pediatric): Secondary | ICD-10-CM

## 2019-10-21 DIAGNOSIS — F250 Schizoaffective disorder, bipolar type: Secondary | ICD-10-CM | POA: Diagnosis not present

## 2019-10-21 DIAGNOSIS — F4010 Social phobia, unspecified: Secondary | ICD-10-CM | POA: Diagnosis not present

## 2019-10-21 DIAGNOSIS — F422 Mixed obsessional thoughts and acts: Secondary | ICD-10-CM

## 2019-10-21 DIAGNOSIS — Z79899 Other long term (current) drug therapy: Secondary | ICD-10-CM

## 2019-10-21 DIAGNOSIS — G253 Myoclonus: Secondary | ICD-10-CM

## 2019-10-21 MED ORDER — MODAFINIL 200 MG PO TABS: ORAL_TABLET | ORAL | 5 refills | Status: DC

## 2019-10-21 NOTE — Progress Notes (Signed)
ANES RIGEL [id number] 24-Jan-1975 44 y.o.    Subjective:   Patient ID:  Matthew Reed is a 44 y.o. (DOB 1975-07-18) male.  Chief Complaint:  Chief Complaint  Patient presents with  . Follow-up  . Depression  . Anxiety  . Manic Behavior  . Nicotine Dependence     HPI  Matthew Reed presents to the office today for follow-up of smoking and schizoaffective disorder, MCI and Se meds.  seen August 2020.  He wanted to have a trial of Wellbutrin for smoking cessation that was initiated 150 mg in the morning to then increase to 300 mg, the usual dose.  Patient called October 5 with the following complaint:Pt is having pretty bad side effects he thinks its from the Lithium he is on. Pt's leg start twitching then he falls down. He said its been going on for awhile, but has got worse.  He was given the following response from our office: Patient's complaints are most consistent with myoclonic jerks.  These can occur with lithium.  They can also occur with clozapine.  They are more common when clozapine dosages are above 600 mg daily and the patient is taking 700 mg daily. Serum lithium level was 1.0.  Serum BMP is within normal limits including creatinine 1.26 calcium 9.1.  TSH is also normal at 3.1 His lithium level is not high.  Usually lithium related jerks do not lead to falls.  It is more likely this is related to clozapine.  The treatment for clozapine related jerks is lamotrigine. The last note from the patient states that he has already reduced the lithium by half tablet daily.  If he has done that for over a week then the lithium is not the cause and we need to start the lamotrigine.  We cannot reduce the lithium further.  It typically takes about 1 week for reduction in lithium dosage to lead to reduction in side effects.  If it has been 1 full week then it is necessary to start the low-dose lamotrigine as noted below.  If it has been less than 1 week he can wait until it has been  1 week and then notify us. Because it is a Friday I will go ahead and send in the prescription for lamotrigine and he can choose to start it now or defer until he has been on the lower dose of lithium for 1 full week.  Instructions on lamotrigine dosing will be on the prescription.  TC Reduced lithium to 2 daily about 5 days and the jerks are improved.  [place] a couple of times.  Better now.   Has reduced the clozapine from 700 to 600 mg daily. He's reduced his smoking by 1/2 or more.  This will tend to increase the blood clozapine. If the sx continue mid next week then call and may require checking blood level of clozapine or adding lamotrigine.  Wellbutrin has helped reduce the smoking. There are several issues that could be going on here.  Both lithium and clozapine can clear trigger myoclonic jerks.  He is already reduced the lithium.  He is also reduced to clozapine but he is also reduce smoking which will tend to increase the clozapine blood level.  Clozapine levels above 600 mg a day can trigger myoclonic jerks and potentially dose-related seizures.  Often lamotrigine is used to counteract this problem.  However there have been so many changes lately that it seems prudent to wait until next week once  the lithium levels are more stable in order to better evaluate the situation.  If the jerks continue then we will probably check a blood level of clozapine and also add lamotrigine. If jerks continue then call next week.  He agrees to this plan.  His lithium level in October before he reduced lithium was 1.0 Since that appointment he has reduced both lithium and clozapine because of jerks.   seen December 04, 2018 and the following was discussed. He just increased the lamotrigine to 100 early November and hasn't seen changes with the jerks.  They are scary bc have caused falls 3-4 times since here but last time was bc tripped.  Legs and arms will jerk.  Less tremor lately with hands.  Legs  will get week and shake and not feel strong enough to carry him.  If he tries to lift something over 20# it will happen.  No unusual back problems.  Hx sciatica resolved. Reduced smoking from 40 to 10 daily with Wellbutrin 150 helping.    OK trial Wellbutrin XL  300 mg AM retrial for smoking cessation DT failure of Chantix and nicotine patches.  Disc SE in detail.  Discussed the risk that this could increasing's anxiety or cause paranoia or mania.  Because of the severity of risk of smoking to his overall health it is reasonable to try this. Disc DDI clozapine and smoking.  Evidence suggests 40% lower levels in smokers which may require reducing the clozapine as he quits smoking.  He has reduced from 700 to 500 mg in the last week.   OK reduce clozapine to 400 mg daily.  Disc relapse risk.  He called December 1 reporting the leg jerks were better but not gone and wanted an increase to low lamotrigine to try to help with this problem.  It was increased from 100 to 150 mg daily at that time.  seen January 20, 2019. No problems are worse with reduction in clozapine to 400 mg and sleepiness is better.  Still tends to worry over things too much.  Paranoia still controlled. No mood swings.   Cigarettes 10-12 daily.  Wellbutrin helped him cut back.  Wants to stop. Jerks are 80-90% better and mainly when doing heavy lifting. He wanted to increase the Wellbutrin XL to 450 mg daily to see if he could completely stop smoking and that dose change was made.  03/18/2019 appointment the following is noted: Started having more paranoia and increased clozapine to 500 mg and the paranoia resolved. Forgot to increase Wellbutrin and still smoking 7-10 days.  Working PT and it's good.  Just a few hours weekly.  Gets fatigued if does it too much. Jerks are 80% better and asks about increasing lamotrigine from 225 mg AM. Still preaches and tolerated critique.  No paranoia.  Mood stable.  No mania.  Cut hours PT at work bc  too stressed and that helped.  Patient reports stable mood and denies depressed or irritable moods.   Patient denies difficulty with sleep initiation or maintenance. Sleep 11-1028. Using BiPap. Denies appetite disturbance.  Patient reports that energy and motivation have been good.  Patient denies any difficulty with concentration.  Patient denies any suicidal ideation. Asks about increasing Wellbutrin to the max to try stop smoking completely.  He is aware of the risk of it causing more anxiety and paranoia.   Plan: Increase Wellbutrin XL to 450 mg daily to maximize benefit for smoking cessation. Increase lamotrigine to 2 daily to  help with jerks.  06/17/2019 appointment the following is noted: Took Wellbutrin 450 mg for awhile and was cranky, moody and paranoid and stopped and it resolved off of it. It did help with smoking.  Increased smoking caused increased paranoia and needed to increase clozapine to 600 mg daily from 500 mg daily. Some PT work stressed dealing with the public but usually can handle it if not around people so much. Sleep OK with bipap with good reading.  Machine is 45 years old. Still occ speaks at church. Jerking is a lot better but it still happens.  No falls. Propranolol helps anxiety and tremor. Plan: For the jerks increase lamotrigine to 2 tablets in the morning. Increase Wellbutrin to 3 of the 150 mg tablets each morning. He forgot to do this last visit.  10/21/19 appt noted: Couldn't tolerate Wellbutrin DT moodiness and stopped. CO OCD anxiety compulsively checks for keys and schedule bc fear of losing or forgetting things.  Hides his checking from others.  Occ may have to recheck but obsesses on it.  Wife doesn't notice his OCD.   Anxiety is not too bad otherwise.  Not paranoid.  Not depressed.   Patient reports stable mood and denies depressed or irritable moods.  Patient denies any recent difficulty with anxiety.  Patient denies difficulty with sleep initiation or  maintenance. Denies appetite disturbance.  Patient reports that energy and motivation have been good.  Patient denies any difficulty with concentration.  Patient denies any suicidal ideation. Grinding teeth he thinks at night.   No problems with other meds.   Less leg twitches now.   Gets tired about 2-3 in afternoon.  He reduced the clozapine dose and 2017 and gradually had worsening anxiety and paranoia at 500 mg daily.  Past Psychiatric Medication Trials:  On clozapine 700 since July 2018 to August 2020.  Chantix N and irritable and tired.  Tried twice.  Wellbutrin 300 Haldol dystonia  olanzapine for many years, ziprasidone, risperidone,  benzodiazepine dependence remotely,  modafinil,  propranolol, fluvoxamine,  Aricept no response, Exelon partial response, Nuvigil, Provigil Remote history of abuse of CBZ  Review of Systems:  Review of Systems  Constitutional: Negative for fatigue.  Cardiovascular: Negative for palpitations.  Neurological: Negative for tremors and weakness.       Myoclonic jerks not evident in the office but reported.  Psychiatric/Behavioral: Negative for agitation, behavioral problems, confusion, decreased concentration, dysphoric mood, hallucinations, self-injury, sleep disturbance and suicidal ideas. The patient is not nervous/anxious and is not hyperactive.   Not usually short of breath.  Medications: I have reviewed the patient's current medications.  Current Outpatient Medications  Medication Sig Dispense Refill  . cloZAPine (CLOZARIL) 100 MG tablet TAKE 7 TABLETS BY MOUTH EVERY NIGHT AT BEDTIME (Patient taking differently: Take 600 mg by mouth daily. ) 210 tablet 6  . lamoTRIgine (LAMICTAL) 150 MG tablet Take 2 tablets (300 mg total) by mouth daily. 180 tablet 1  . lithium carbonate (ESKALITH) 450 MG CR tablet TAKE 1 TABLET(450 MG) BY MOUTH TWICE DAILY 60 tablet 2  . modafinil (PROVIGIL) 200 MG tablet TAKE 1 TABLET(200 MG) BY MOUTH EVERY MORNING 30 tablet  5  . propranolol (INDERAL) 20 MG tablet TAKE 1 TO 3 TABLETS BY MOUTH TWICE DAILY 540 tablet 0   No current facility-administered medications for this visit.    Medication Side Effect: fatigue  Allergies: No Known Allergies  Past Medical History:  Diagnosis Date  . Schizoaffective disorder ([place])  Family History  Problem Relation Age of Onset  . Kidney cancer Mother   . Diabetes Mother   . Hypertension Mother   . Hyperlipidemia Mother   . Hypertension Father     Social History   Socioeconomic History  . Marital status: Married    Spouse name: Not on file  . Number of children: Not on file  . Years of education: Not on file  . Highest education level: Not on file  Occupational History  . 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  Tobacco Use  . Smoking status: Current Every Day Smoker    Packs/day: 1.00    Years: 25.00    Pack years: 25.00    Types: Cigarettes  . Smokeless tobacco: Never Used  Vaping Use  . Vaping Use: Never used  Substance and Sexual Activity  . Alcohol use: No  . Drug use: No  . Sexual activity: Not on file  Other Topics Concern  . Not on file  Social History Narrative  . Not on file   Social Determinants of Health   Financial Resource Strain:   . Difficulty of Paying Living Expenses: Not on file  Food Insecurity:   . Worried About Charity fundraiser in the Last Year: Not on file  . Ran Out of Food in the Last Year: Not on file  Transportation Needs:   . Lack of Transportation (Medical): Not on file  . Lack of Transportation (Non-Medical): Not on file  Physical Activity:   . Days of Exercise per Week: Not on file  . Minutes of Exercise per Session: Not on file  Stress:   . Feeling of Stress : Not on file  Social Connections:   . Frequency of Communication with Friends and Family: Not on file  . Frequency of Social Gatherings with Friends and Family: Not on file  . Attends Religious Services: Not on file  . Active  Member of Clubs or Organizations: Not on file  . Attends Archivist Meetings: Not on file  . Marital Status: Not on file  Intimate Partner Violence:   . Fear of Current or Ex-Partner: Not on file  . Emotionally Abused: Not on file  . Physically Abused: Not on file  . Sexually Abused: Not on file    Past Medical History, Surgical history, Social history, and Family history were reviewed and updated as appropriate.   Please see review of systems for further details on the patient's review from today.   Objective:   Physical Exam:  There were no vitals taken for this visit.  Physical Exam Constitutional:      General: He is not in acute distress.    Appearance: He is well-developed.  Musculoskeletal:        General: No deformity.  Neurological:     Mental Status: He is alert and oriented to person, place, and time.     Motor: No tremor.     Coordination: Coordination normal.     Gait: Gait normal.  Psychiatric:        Attention and Perception: Attention and perception normal. He is attentive.        Mood and Affect: Mood is anxious. Mood is not depressed. Affect is not labile, blunt, angry or inappropriate.        Speech: Speech normal.        Behavior: Behavior normal.        Thought Content: Thought content normal. Thought content is not  paranoid. Thought content does not include homicidal or suicidal ideation. Thought content does not include homicidal or suicidal plan.        Cognition and Memory: Cognition normal.        Judgment: Judgment normal.     Comments: Insight is good. Mild OCD     Lab Review:     Component Value Date/Time   NA 141 03/09/2007 2256   K 4.1 03/09/2007 2256   CL 105 03/09/2007 2256   CO2 28 03/09/2007 2256   GLUCOSE 107 (H) 03/09/2007 2256   BUN 7 03/09/2007 2256   CREATININE 0.94 03/09/2007 2256   CALCIUM 9.5 03/09/2007 2256   GFRNONAA >60 03/09/2007 2256   GFRAA  03/09/2007 2256    >60        The eGFR has been  calculated using the MDRD equation. This calculation has not been validated in all clinical       Component Value Date/Time   WBC 4.7 03/09/2007 2256   RBC 5.06 03/09/2007 2256   HGB 16.1 03/09/2007 2256   HCT 45.7 03/09/2007 2256   PLT 164 03/09/2007 2256   MCV 90.2 03/09/2007 2256   MCHC 35.3 03/09/2007 2256   RDW 12.6 03/09/2007 2256   LYMPHSABS 2.1 03/09/2007 2256   MONOABS 0.4 03/09/2007 2256   EOSABS 0.0 03/09/2007 2256   BASOSABS 0.1 03/09/2007 2256    No results found for: POCLITH, LITHIUM   Lab Results  Component Value Date   VALPROATE 78.4 03/15/2007     .res Assessment: Plan:    Schizoaffective disorder, bipolar type ([place])  Nicotine dependence with withdrawal, unspecified nicotine product type  Social anxiety disorder  Mixed obsessional thoughts and acts  Myoclonic jerking  Generalized anxiety disorder  Lithium use   He failed to tolerate even the lower dose of Chantix.  Though it was helpful.  His schizoaffective disorder symptoms including paranoia and irritability are well controlled with the current medications.  Including the clozapine.  His CBC with differential has been stable and unremarkable.  These have been recorded in the paper chart because it is difficult to send the results to the pharmacy and our [facility] through epic we discussed the side effects of clozapine in   detail.    We have tried to mitigate excessive tiredness from clozapine that through the use of modafinil or Nuvigil with partial success. He is having myoclonic jerks in legs likely from clozapine.     We discussed the risk of worsening paranoia if we reduce the dosage.  He is willing to tolerate this risk.  He has mild cognitive impairment is not that severe.  His anxiety is manageable at the moment with current medications.  Myoclonic jerks are better but not gone and he wants trial of higher dose.  Increase to 300 mg daily.  No changes For the jerks continue  lamotrigine to 2 tablets in the morning.  It appears to be helping.  ANC levels are within normal limits.  Options for labs [facility] or [facility] He and this office are having difficulties dealing with his current lab which is an independent laboratory that does not send results in epic.   Wellbutrin to 3 of the 150 mg tablets each morning  Made him moody and he had to stop it..   Prefer to avoid adding meds for mild OCD if possible DT risk destabilizing.  Consider afternoon modafinil. Yes, 200 mg in AM and 100 mg in PM  Disc DDI clozapine and  smoking.  Evidence suggests 40% lower levels in smokers which may require reducing the clozapine as he quits smoking.  He has reduced from 700 to 500 mg in the last few months Continue clozapine to 500 mg daily.  Disc relapse risk.  His paranoia had worsened at 400 mg daily.  It resolved again when he increase the dose back to 500 mg daily.  Follow-up 4 mos  Lynder Parents, MD, DFAPA   No future appointments.  No orders of the defined types were placed in this encounter.     -------------------------------

## 2019-11-05 DIAGNOSIS — F259 Schizoaffective disorder, unspecified: Secondary | ICD-10-CM | POA: Diagnosis not present

## 2019-11-06 ENCOUNTER — Encounter: Payer: Self-pay | Admitting: Psychiatry

## 2019-11-09 ENCOUNTER — Other Ambulatory Visit: Payer: Self-pay

## 2019-11-09 ENCOUNTER — Telehealth: Payer: Self-pay | Admitting: Psychiatry

## 2019-11-09 MED ORDER — CLOZAPINE 100 MG PO TABS: 600.0000 mg | ORAL_TABLET | Freq: Every day | ORAL | 5 refills | Status: DC

## 2019-11-09 NOTE — Telephone Encounter (Signed)
Pt said that the pharmacy will need a new rx for his Clozapine. Please send to [facility] in [place].

## 2019-11-09 NOTE — Telephone Encounter (Signed)
Rx pended for Dr. Jennelle Human to review dose

## 2019-11-11 ENCOUNTER — Telehealth: Payer: Self-pay | Admitting: Psychiatry

## 2019-11-11 NOTE — Telephone Encounter (Signed)
His refill was sent as requested, was there something else?

## 2019-11-11 NOTE — Telephone Encounter (Signed)
Matthew Reed called to follow up on phone call he left yesterday about Clozapine. Pharmary is Scientist, research (medical) of [facility] in [place], [place].

## 2019-11-12 ENCOUNTER — Telehealth: Payer: Self-pay | Admitting: Psychiatry

## 2019-11-12 NOTE — Telephone Encounter (Signed)
CBC  Lab results were sent today to [facility] and [facility] in [place].

## 2019-11-13 ENCOUNTER — Other Ambulatory Visit: Payer: Self-pay | Admitting: Psychiatry

## 2019-11-13 DIAGNOSIS — G253 Myoclonus: Secondary | ICD-10-CM

## 2019-12-04 ENCOUNTER — Encounter: Payer: Self-pay | Admitting: Psychiatry

## 2019-12-07 ENCOUNTER — Other Ambulatory Visit: Payer: Self-pay | Admitting: Psychiatry

## 2019-12-07 ENCOUNTER — Telehealth: Payer: Self-pay | Admitting: Psychiatry

## 2019-12-07 MED ORDER — CLOZAPINE 100 MG PO TABS: 600.0000 mg | ORAL_TABLET | Freq: Every day | ORAL | 0 refills | Status: DC

## 2019-12-07 NOTE — Telephone Encounter (Signed)
Please review

## 2019-12-07 NOTE — Telephone Encounter (Signed)
Patient's next appt is 03/21/20. Requesting refill on Clozapine called to [facility] [phone]. He said he has to have labs before he gets refills but he said the lab is closed from 11/22 thru 11/26 for Thanksgiving. Can he have just a few to last until 11/26 when the lab opens up? He is working not so call his wife at [phone].

## 2019-12-07 NOTE — Telephone Encounter (Signed)
done

## 2019-12-08 ENCOUNTER — Other Ambulatory Visit: Payer: Self-pay | Admitting: Psychiatry

## 2019-12-08 ENCOUNTER — Telehealth: Payer: Self-pay | Admitting: Psychiatry

## 2019-12-08 DIAGNOSIS — F250 Schizoaffective disorder, bipolar type: Secondary | ICD-10-CM | POA: Diagnosis not present

## 2019-12-08 DIAGNOSIS — Z79899 Other long term (current) drug therapy: Secondary | ICD-10-CM

## 2019-12-08 NOTE — Telephone Encounter (Signed)
Order was put in and faxed as requested to [facility]

## 2019-12-08 NOTE — Telephone Encounter (Signed)
Pharmacy LM on VM stating [facility] is open and phone # [phone] fax # [phone]. Advise pt and Pharmacy when orders are faxed. NEED ASAP

## 2019-12-20 ENCOUNTER — Other Ambulatory Visit: Payer: Self-pay | Admitting: Psychiatry

## 2020-01-05 LAB — CBC WITH DIFFERENTIAL/PLATELET
Basophils Absolute: 0.1 10*3/uL (ref 0.0–0.2)
Basos: 1 %
EOS (ABSOLUTE): 0 10*3/uL (ref 0.0–0.4)
Eos: 0 %
Hematocrit: 47 % (ref 37.5–51.0)
Hemoglobin: 15.7 g/dL (ref 13.0–17.7)
Immature Grans (Abs): 0 10*3/uL (ref 0.0–0.1)
Immature Granulocytes: 1 %
Lymphocytes Absolute: 1.7 10*3/uL (ref 0.7–3.1)
Lymphs: 28 %
MCH: 29.7 pg (ref 26.6–33.0)
MCHC: 33.4 g/dL (ref 31.5–35.7)
MCV: 89 fL (ref 79–97)
Monocytes Absolute: 0.4 10*3/uL (ref 0.1–0.9)
Monocytes: 6 %
Neutrophils Absolute: 3.9 10*3/uL (ref 1.4–7.0)
Neutrophils: 64 %
Platelets: 171 10*3/uL (ref 150–450)
RBC: 5.28 x10E6/uL (ref 4.14–5.80)
RDW: 13.4 % (ref 11.6–15.4)
WBC: 6.1 10*3/uL (ref 3.4–10.8)

## 2020-01-06 ENCOUNTER — Telehealth: Payer: Self-pay | Admitting: Psychiatry

## 2020-01-06 NOTE — Telephone Encounter (Signed)
Dr. Jennelle Human, are [facility] labs ready and is it ok to send to his pharmacy?

## 2020-02-02 ENCOUNTER — Encounter: Payer: Self-pay | Admitting: Psychiatry

## 2020-02-02 DIAGNOSIS — F259 Schizoaffective disorder, unspecified: Secondary | ICD-10-CM | POA: Diagnosis not present

## 2020-02-22 ENCOUNTER — Encounter: Payer: Self-pay | Admitting: Psychiatry

## 2020-02-22 ENCOUNTER — Ambulatory Visit (INDEPENDENT_AMBULATORY_CARE_PROVIDER_SITE_OTHER): Payer: Medicare Other | Admitting: Psychiatry

## 2020-02-22 ENCOUNTER — Other Ambulatory Visit: Payer: Self-pay

## 2020-02-22 DIAGNOSIS — Z79899 Other long term (current) drug therapy: Secondary | ICD-10-CM

## 2020-02-22 DIAGNOSIS — F17203 Nicotine dependence unspecified, with withdrawal: Secondary | ICD-10-CM

## 2020-02-22 DIAGNOSIS — F422 Mixed obsessional thoughts and acts: Secondary | ICD-10-CM | POA: Diagnosis not present

## 2020-02-22 DIAGNOSIS — F250 Schizoaffective disorder, bipolar type: Secondary | ICD-10-CM

## 2020-02-22 DIAGNOSIS — G253 Myoclonus: Secondary | ICD-10-CM

## 2020-02-22 DIAGNOSIS — G4733 Obstructive sleep apnea (adult) (pediatric): Secondary | ICD-10-CM | POA: Diagnosis not present

## 2020-02-22 DIAGNOSIS — F4010 Social phobia, unspecified: Secondary | ICD-10-CM | POA: Diagnosis not present

## 2020-02-22 DIAGNOSIS — F411 Generalized anxiety disorder: Secondary | ICD-10-CM

## 2020-02-22 MED ORDER — SERTRALINE HCL 100 MG PO TABS: ORAL_TABLET | ORAL | 1 refills | Status: DC

## 2020-02-22 NOTE — Progress Notes (Signed)
Matthew Reed [id number] 11-Jun-1975 45 y.o.    Subjective:   Patient ID:  Matthew Reed is a 45 y.o. (DOB 03/02/1975) male.  Chief Complaint:  Chief Complaint  Patient presents with  . Schizoaffective disorder  . bipolar type ([place])  . Follow-up  . Anxiety    OCD     HPI  Matthew Reed presents to the office today for follow-up of smoking and schizoaffective disorder, MCI and Se meds.  seen August 2020.  He wanted to have a trial of Wellbutrin for smoking cessation that was initiated 150 mg in the morning to then increase to 300 mg, the usual dose.  Patient called October 5 with the following complaint:Pt is having pretty bad side effects he thinks its from the Lithium he is on. Pt's leg start twitching then he falls down. He said its been going on for awhile, but has got worse.  He was given the following response from our office: Patient's complaints are most consistent with myoclonic jerks.  These can occur with lithium.  They can also occur with clozapine.  They are more common when clozapine dosages are above 600 mg daily and the patient is taking 700 mg daily. Serum lithium level was 1.0.  Serum BMP is within normal limits including creatinine 1.26 calcium 9.1.  TSH is also normal at 3.1 His lithium level is not high.  Usually lithium related jerks do not lead to falls.  It is more likely this is related to clozapine.  The treatment for clozapine related jerks is lamotrigine. The last note from the patient states that he has already reduced the lithium by half tablet daily.  If he has done that for over a week then the lithium is not the cause and we need to start the lamotrigine.  We cannot reduce the lithium further.  It typically takes about 1 week for reduction in lithium dosage to lead to reduction in side effects.  If it has been 1 full week then it is necessary to start the low-dose lamotrigine as noted below.  If it has been less than 1 week he can wait until it has  been 1 week and then notify us. Because it is a Friday I will go ahead and send in the prescription for lamotrigine and he can choose to start it now or defer until he has been on the lower dose of lithium for 1 full week.  Instructions on lamotrigine dosing will be on the prescription.  TC Reduced lithium to 2 daily about 5 days and the jerks are improved.  [place] a couple of times.  Better now.   Has reduced the clozapine from 700 to 600 mg daily. He's reduced his smoking by 1/2 or more.  This will tend to increase the blood clozapine. If the sx continue mid next week then call and may require checking blood level of clozapine or adding lamotrigine.  Wellbutrin has helped reduce the smoking. There are several issues that could be going on here.  Both lithium and clozapine can clear trigger myoclonic jerks.  He is already reduced the lithium.  He is also reduced to clozapine but he is also reduce smoking which will tend to increase the clozapine blood level.  Clozapine levels above 600 mg a day can trigger myoclonic jerks and potentially dose-related seizures.  Often lamotrigine is used to counteract this problem.  However there have been so many changes lately that it seems prudent to wait until next  week once the lithium levels are more stable in order to better evaluate the situation.  If the jerks continue then we will probably check a blood level of clozapine and also add lamotrigine. If jerks continue then call next week.  He agrees to this plan.  His lithium level in October before he reduced lithium was 1.0 Since that appointment he has reduced both lithium and clozapine because of jerks.   seen December 04, 2018 and the following was discussed. He just increased the lamotrigine to 100 early November and hasn't seen changes with the jerks.  They are scary bc have caused falls 3-4 times since here but last time was bc tripped.  Legs and arms will jerk.  Less tremor lately with hands.   Legs will get week and shake and not feel strong enough to carry him.  If he tries to lift something over 20# it will happen.  No unusual back problems.  Hx sciatica resolved. Reduced smoking from 40 to 10 daily with Wellbutrin 150 helping.    OK trial Wellbutrin XL  300 mg AM retrial for smoking cessation DT failure of Chantix and nicotine patches.  Disc SE in detail.  Discussed the risk that this could increasing's anxiety or cause paranoia or mania.  Because of the severity of risk of smoking to his overall health it is reasonable to try this. Disc DDI clozapine and smoking.  Evidence suggests 40% lower levels in smokers which may require reducing the clozapine as he quits smoking.  He has reduced from 700 to 500 mg in the last week.   OK reduce clozapine to 400 mg daily.  Disc relapse risk.  He called December 1 reporting the leg jerks were better but not gone and wanted an increase to low lamotrigine to try to help with this problem.  It was increased from 100 to 150 mg daily at that time.  seen January 20, 2019. No problems are worse with reduction in clozapine to 400 mg and sleepiness is better.  Still tends to worry over things too much.  Paranoia still controlled. No mood swings.   Cigarettes 10-12 daily.  Wellbutrin helped him cut back.  Wants to stop. Jerks are 80-90% better and mainly when doing heavy lifting. He wanted to increase the Wellbutrin XL to 450 mg daily to see if he could completely stop smoking and that dose change was made.  03/18/2019 appointment the following is noted: Started having more paranoia and increased clozapine to 500 mg and the paranoia resolved. Forgot to increase Wellbutrin and still smoking 7-10 days.  Working PT and it's good.  Just a few hours weekly.  Gets fatigued if does it too much. Jerks are 80% better and asks about increasing lamotrigine from 225 mg AM. Still preaches and tolerated critique.  No paranoia.  Mood stable.  No mania.  Cut hours PT at work  bc too stressed and that helped.  Patient reports stable mood and denies depressed or irritable moods.   Patient denies difficulty with sleep initiation or maintenance. Sleep 11-1028. Using BiPap. Denies appetite disturbance.  Patient reports that energy and motivation have been good.  Patient denies any difficulty with concentration.  Patient denies any suicidal ideation. Asks about increasing Wellbutrin to the max to try stop smoking completely.  He is aware of the risk of it causing more anxiety and paranoia.   Plan: Increase Wellbutrin XL to 450 mg daily to maximize benefit for smoking cessation. Increase lamotrigine to 2  daily to help with jerks.  06/17/2019 appointment the following is noted: Took Wellbutrin 450 mg for awhile and was cranky, moody and paranoid and stopped and it resolved off of it. It did help with smoking.  Increased smoking caused increased paranoia and needed to increase clozapine to 600 mg daily from 500 mg daily. Some PT work stressed dealing with the public but usually can handle it if not around people so much. Sleep OK with bipap with good reading.  Machine is 46 years old. Still occ speaks at church. Jerking is a lot better but it still happens.  No falls. Propranolol helps anxiety and tremor. Plan: For the jerks increase lamotrigine to 2 tablets in the morning. Increase Wellbutrin to 3 of the 150 mg tablets each morning. He forgot to do this last visit.  10/21/19 appt noted: Couldn't tolerate Wellbutrin DT moodiness and stopped. CO OCD anxiety compulsively checks for keys and schedule bc fear of losing or forgetting things.  Hides his checking from others.  Occ may have to recheck but obsesses on it.  Wife doesn't notice his OCD.   Anxiety is not too bad otherwise.  Not paranoid.  Not depressed.   Patient reports stable mood and denies depressed or irritable moods.  Patient denies any recent difficulty with anxiety.  Patient denies difficulty with sleep initiation or  maintenance. Denies appetite disturbance.  Patient reports that energy and motivation have been good.  Patient denies any difficulty with concentration.  Patient denies any suicidal ideation. Grinding teeth he thinks at night.   No problems with other meds.   Less leg twitches now.   Gets tired about 2-3 in afternoon. Plan: Consider afternoon modafinil. Yes, 200 mg in AM and 100 mg in PM Wellbutrin made him moody and he had to stop it.  02/22/2020 appointment with following noted: OCD is getting worse.  Repeated checking rituals around eating, not sure about anything.  So frustrating.  Checks lights when driving.   No reason for it to be worse unless stress but doesn't feel more stressed out. Fatigue and wants thyroid checked.  No mood swings.  No mania.   Kids  42, 35, 76 yo  He reduced the clozapine dose and 2017 and gradually had worsening anxiety and paranoia at 500 mg daily.  Past Psychiatric Medication Trials:  On clozapine 700 since July 2018 to August 2020.  Chantix N and irritable and tired.  Tried twice.  Wellbutrin 300 Haldol dystonia  olanzapine for many years, ziprasidone, risperidone,  benzodiazepine dependence remotely,  modafinil,  propranolol,  fluvoxamine,  Aricept no response, Exelon partial response, Nuvigil, Provigil Remote history of abuse of CBZ  Review of Systems:  Review of Systems  Constitutional: Positive for fatigue.  Cardiovascular: Negative for palpitations.  Neurological: Negative for tremors and weakness.       Myoclonic jerks not evident in the office but reported.  Psychiatric/Behavioral: Negative for agitation, behavioral problems, confusion, decreased concentration, dysphoric mood, hallucinations, self-injury, sleep disturbance and suicidal ideas. The patient is not nervous/anxious and is not hyperactive.   Not usually short of breath.  Medications: I have reviewed the patient's current medications.  Current Outpatient Medications  Medication  Sig Dispense Refill  . cloZAPine (CLOZARIL) 100 MG tablet Take 6 tablets (600 mg total) by mouth daily. 42 tablet 0  . lamoTRIgine (LAMICTAL) 150 MG tablet TAKE 2 TABLETS(300 MG) BY MOUTH DAILY 180 tablet 1  . lithium carbonate (ESKALITH) 450 MG CR tablet TAKE 1 TABLET(450 MG) BY  MOUTH TWICE DAILY 60 tablet 2  . modafinil (PROVIGIL) 200 MG tablet 1 in the AM and 1/2 at 2 PM. 45 tablet 5  . propranolol (INDERAL) 20 MG tablet TAKE 1 TO 3 TABLETS BY MOUTH TWICE DAILY 540 tablet 0  . sertraline (ZOLOFT) 100 MG tablet 1/2 daily for 1 week then 1 daily 30 tablet 1   No current facility-administered medications for this visit.    Medication Side Effect: fatigue  Allergies: No Known Allergies  Past Medical History:  Diagnosis Date  . Schizoaffective disorder ([place])     Family History  Problem Relation Age of Onset  . Kidney cancer Mother   . Diabetes Mother   . Hypertension Mother   . Hyperlipidemia Mother   . Hypertension Father     Social History   Socioeconomic History  . Marital status: Married    Spouse name: Not on file  . Number of children: Not on file  . Years of education: Not on file  . Highest education level: Not on file  Occupational History  . 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  Tobacco Use  . Smoking status: Current Every Day Smoker    Packs/day: 1.00    Years: 25.00    Pack years: 25.00    Types: Cigarettes  . Smokeless tobacco: Never Used  Vaping Use  . Vaping Use: Never used  Substance and Sexual Activity  . Alcohol use: No  . Drug use: No  . Sexual activity: Not on file  Other Topics Concern  . Not on file  Social History Narrative  . Not on file   Social Determinants of Health   Financial Resource Strain: Not on file  Food Insecurity: Not on file  Transportation Needs: Not on file  Physical Activity: Not on file  Stress: Not on file  Social Connections: Not on file  Intimate Partner Violence: Not on file    Past  Medical History, Surgical history, Social history, and Family history were reviewed and updated as appropriate.   Please see review of systems for further details on the patient's review from today.   Objective:   Physical Exam:  There were no vitals taken for this visit.  Physical Exam Constitutional:      General: He is not in acute distress.    Appearance: He is well-developed.  Musculoskeletal:        General: No deformity.  Neurological:     Mental Status: He is alert and oriented to person, place, and time.     Motor: No tremor.     Coordination: Coordination normal.     Gait: Gait normal.  Psychiatric:        Attention and Perception: Attention and perception normal. He is attentive.        Mood and Affect: Mood is anxious. Mood is not depressed. Affect is not labile, blunt, angry or inappropriate.        Speech: Speech normal.        Behavior: Behavior normal.        Thought Content: Thought content normal. Thought content is not paranoid. Thought content does not include homicidal or suicidal ideation. Thought content does not include homicidal or suicidal plan.        Cognition and Memory: Cognition normal.        Judgment: Judgment normal.     Comments: Insight is good. OCD is worse     Lab Review:     Component Value  Date/Time   NA 141 03/09/2007 2256   K 4.1 03/09/2007 2256   CL 105 03/09/2007 2256   CO2 28 03/09/2007 2256   GLUCOSE 107 (H) 03/09/2007 2256   BUN 7 03/09/2007 2256   CREATININE 0.94 03/09/2007 2256   CALCIUM 9.5 03/09/2007 2256   GFRNONAA >60 03/09/2007 2256   GFRAA  03/09/2007 2256    >60        The eGFR has been calculated using the MDRD equation. This calculation has not been validated in all clinical       Component Value Date/Time   WBC 6.1 01/04/2020 0853   WBC 4.7 03/09/2007 2256   RBC 5.28 01/04/2020 0853   RBC 5.06 03/09/2007 2256   HGB 15.7 01/04/2020 0853   HCT 47.0 01/04/2020 0853   PLT 171 01/04/2020 0853   MCV 89  01/04/2020 0853   MCH 29.7 01/04/2020 0853   MCHC 33.4 01/04/2020 0853   MCHC 35.3 03/09/2007 2256   RDW 13.4 01/04/2020 0853   LYMPHSABS 1.7 01/04/2020 0853   MONOABS 0.4 03/09/2007 2256   EOSABS 0.0 01/04/2020 0853   BASOSABS 0.1 01/04/2020 0853    No results found for: POCLITH, LITHIUM   Lab Results  Component Value Date   VALPROATE 78.4 03/15/2007     .res Assessment: Plan:    Schizoaffective disorder, bipolar type ([place]) - Plan: Lithium level, TSH, CANCELED: Lithium level, CANCELED: TSH  Social anxiety disorder - Plan: sertraline (ZOLOFT) 100 MG tablet  Mixed obsessional thoughts and acts - Plan: sertraline (ZOLOFT) 100 MG tablet  Generalized anxiety disorder - Plan: sertraline (ZOLOFT) 100 MG tablet  Obstructive sleep apnea  Long term current use of clozapine  Myoclonic jerking  Nicotine dependence with withdrawal, unspecified nicotine product type  Lithium use - Plan: Lithium level, TSH, CANCELED: Lithium level, CANCELED: TSH   He failed to tolerate even the lower dose of Chantix.  Though it was helpful.  His schizoaffective disorder symptoms including paranoia and irritability are well controlled with the current medications.  Including the clozapine.  His CBC with differential has been stable and unremarkable.  These have been recorded in the paper chart because it is difficult to send the results to the pharmacy and our REMS through epic we discussed the side effects of clozapine in detail.    We have tried to mitigate excessive tiredness from clozapine that through the use of modafinil or Nuvigil with partial success.  He's using CPAP regularly.  We discussed the risk of worsening paranoia if we reduce the dosage.  He is willing to tolerate this risk.  He has mild cognitive impairment is not that severe.  His anxiety is manageable at the moment with current medications.   He is having myoclonic jerks in legs likely from clozapine.   Myoclonic jerks are  better but not gone and he wants trial of higher dose.  Increase to 300 mg daily.  No changes For the jerks continue lamotrigine to 2 tablets in the morning.  It appears to be helping.  ANC levels are within normal limits.  Options for labs [facility] or [facility]  Check TSH and lithium level DT Fatigue.  He and this office are having difficulties dealing with his current lab which is an independent laboratory that does not send results in epic.  Prefer to avoid adding meds for OCD if possible DT risk destabilizing but it's worse and h's willing to accept the risk..  Continue modafinil. Which helps a little. [place]  mg in AM and 100 mg in PM  Disc DDI clozapine and smoking.  Evidence suggests 40% lower levels in smokers which may require reducing the clozapine as he quits smoking.  He has reduced from 700 to 500 mg in the last few months Continue clozapine to 500 mg daily.  Disc relapse risk.  His paranoia had worsened at 400 mg daily.  It resolved again when he increase the dose back to 500 mg daily.  Follow-up 4 mos  Lynder Parents, MD, DFAPA   No future appointments.  Orders Placed This Encounter  Procedures  . Lithium level  . TSH      -------------------------------

## 2020-02-23 DIAGNOSIS — F259 Schizoaffective disorder, unspecified: Secondary | ICD-10-CM | POA: Diagnosis not present

## 2020-02-23 DIAGNOSIS — Z79899 Other long term (current) drug therapy: Secondary | ICD-10-CM | POA: Diagnosis not present

## 2020-02-24 NOTE — Progress Notes (Signed)
Lithium level 0.8.  Normal TSH

## 2020-02-29 DIAGNOSIS — Z79899 Other long term (current) drug therapy: Secondary | ICD-10-CM | POA: Diagnosis not present

## 2020-03-01 ENCOUNTER — Encounter: Payer: Self-pay | Admitting: Psychiatry

## 2020-03-10 ENCOUNTER — Telehealth: Payer: Self-pay | Admitting: Psychiatry

## 2020-03-10 NOTE — Telephone Encounter (Signed)
Matthew Reed with [facility] [place] called and needs an updated lab order for Matthew Reed. Fax # is [phone].

## 2020-03-11 ENCOUNTER — Other Ambulatory Visit: Payer: Self-pay | Admitting: Psychiatry

## 2020-03-11 DIAGNOSIS — Z79899 Other long term (current) drug therapy: Secondary | ICD-10-CM

## 2020-03-11 DIAGNOSIS — F250 Schizoaffective disorder, bipolar type: Secondary | ICD-10-CM

## 2020-03-11 NOTE — Telephone Encounter (Signed)
I signed the lab request.  When you get a chance come get it and fax it.

## 2020-03-11 NOTE — Telephone Encounter (Signed)
Fax is complete

## 2020-03-20 ENCOUNTER — Other Ambulatory Visit: Payer: Self-pay | Admitting: Psychiatry

## 2020-03-21 NOTE — Telephone Encounter (Signed)
Check on refill °

## 2020-03-25 DIAGNOSIS — F250 Schizoaffective disorder, bipolar type: Secondary | ICD-10-CM | POA: Diagnosis not present

## 2020-03-25 DIAGNOSIS — Z79899 Other long term (current) drug therapy: Secondary | ICD-10-CM | POA: Diagnosis not present

## 2020-03-28 ENCOUNTER — Encounter: Payer: Self-pay | Admitting: Psychiatry

## 2020-04-18 ENCOUNTER — Other Ambulatory Visit: Payer: Self-pay

## 2020-04-18 ENCOUNTER — Encounter: Payer: Self-pay | Admitting: Pulmonary Disease

## 2020-04-18 ENCOUNTER — Ambulatory Visit (INDEPENDENT_AMBULATORY_CARE_PROVIDER_SITE_OTHER): Payer: Medicare Other | Admitting: Pulmonary Disease

## 2020-04-18 VITALS — BP 124/74 | HR 71 | Temp 97.6°F | Ht 68.0 in | Wt 210.4 lb

## 2020-04-18 DIAGNOSIS — G4733 Obstructive sleep apnea (adult) (pediatric): Secondary | ICD-10-CM | POA: Diagnosis not present

## 2020-04-18 DIAGNOSIS — Z720 Tobacco use: Secondary | ICD-10-CM

## 2020-04-18 NOTE — Patient Instructions (Signed)
Will arrange for replacement Bipap mask and supplies  Follow up in 1 year

## 2020-04-18 NOTE — Progress Notes (Signed)
Matthew Reed, Critical Care, and Sleep Medicine  Chief Complaint  Patient presents with  . Follow-up    Needs new supplies for bipap    Constitutional:  BP 124/74 (BP Location: Left Arm, Cuff Size: Normal)   Pulse 71   Temp 97.6 F (36.4 C) (Temporal)   Ht 5\' 8"  (1.727 m)   Wt 210 lb 6.4 oz (95.4 kg)   SpO2 99% Comment: Room air  BMI 31.99 kg/m   Past Medical History:  Schizoaffective disorder  Past Surgical History:  He  has a past surgical history that includes No past surgeries.  Brief Summary:  Matthew Reed is a 45 y.o. male smoker with obstructive sleep apnea.      Subjective:   He uses Bipap nightly.  No issues with mask fit.  Not having sinus congestion, sore throat, or dry mouth.  Uses modafinil to help stay awake, and this has helped.  Still smoking cigarettes.  Chantix made him feel sick  Bupropion didn't help before.  Physical Exam:   Appearance - well kempt   ENMT - no sinus tenderness, no oral exudate, no LAN, Mallampati 3 airway, no stridor  Respiratory - coarse breath sounds bilaterally, no wheezing or rales  CV - s1s2 regular rate and rhythm, no murmurs  Ext - no clubbing, no edema  Skin - no rashes  Psych - normal mood and affect   Sleep Tests:   PSG2010 >> AHI 49  Bipap 03/15/20 to 04/13/20 >> used on 28 of 30 nights with average 11 hrs 1 min.  Average AHI 2.8 with Bipap 14/10 cm H2O  Social History:  He  reports that he has been smoking cigarettes. He has a 25.00 pack-year smoking history. He has never used smokeless tobacco. He reports that he does not drink alcohol and does not use drugs.  Family History:  His family history includes Diabetes in his mother; Hyperlipidemia in his mother; Hypertension in his father and mother; Kidney cancer in his mother.     Assessment/Plan:   Obstructive sleep apnea - he is compliant with Bipap and reports benefit from therapy - uses [facility] for his DME - continue  Bipap 14/10 cm H2O - will arrange for replacement mask and supplies  Tobacco abuse. - discussed use of nicotine replacement - he will check with his psychiatrist to determine if he could try bupropion again - chantix caused nausea  Schizoaffective. - discussed how his medications could contribute to daytime sleepiness and fatigue - gets modafinil scripts from Dr. 04/15/20  Time Spent Involved in Patient Care on Day of Examination:  22 minutes  Follow up:  Patient Instructions  Will arrange for replacement Bipap mask and supplies  Follow up in 1 year   Medication List:   Allergies as of 04/18/2020   No Known Allergies     Medication List       Accurate as of April 18, 2020 10:45 AM. If you have any questions, ask your nurse or doctor.        cloZAPine 100 MG tablet Commonly known as: CLOZARIL Take 6 tablets (600 mg total) by mouth daily.   lamoTRIgine 150 MG tablet Commonly known as: LAMICTAL TAKE 2 TABLETS(300 MG) BY MOUTH DAILY   lithium carbonate 450 MG CR tablet Commonly known as: ESKALITH TAKE 1 TABLET(450 MG) BY MOUTH TWICE DAILY   modafinil 200 MG tablet Commonly known as: PROVIGIL 1 in the AM and 1/2 at 2 PM.  propranolol 20 MG tablet Commonly known as: INDERAL TAKE 1 TO 3 TABLETS BY MOUTH TWICE DAILY   sertraline 100 MG tablet Commonly known as: ZOLOFT 1/2 daily for 1 week then 1 daily       Signature:  Coralyn Helling, MD [facility] Reed/Critical Care Pager - [phone] 04/18/2020, 10:45 AM

## 2020-04-21 ENCOUNTER — Ambulatory Visit (INDEPENDENT_AMBULATORY_CARE_PROVIDER_SITE_OTHER): Payer: Medicare Other | Admitting: Psychiatry

## 2020-04-21 ENCOUNTER — Encounter: Payer: Self-pay | Admitting: Psychiatry

## 2020-04-21 ENCOUNTER — Other Ambulatory Visit: Payer: Self-pay

## 2020-04-21 DIAGNOSIS — F411 Generalized anxiety disorder: Secondary | ICD-10-CM

## 2020-04-21 DIAGNOSIS — F4010 Social phobia, unspecified: Secondary | ICD-10-CM

## 2020-04-21 DIAGNOSIS — Z79899 Other long term (current) drug therapy: Secondary | ICD-10-CM

## 2020-04-21 DIAGNOSIS — G253 Myoclonus: Secondary | ICD-10-CM | POA: Diagnosis not present

## 2020-04-21 DIAGNOSIS — F17203 Nicotine dependence unspecified, with withdrawal: Secondary | ICD-10-CM | POA: Diagnosis not present

## 2020-04-21 DIAGNOSIS — F422 Mixed obsessional thoughts and acts: Secondary | ICD-10-CM | POA: Diagnosis not present

## 2020-04-21 DIAGNOSIS — F250 Schizoaffective disorder, bipolar type: Secondary | ICD-10-CM

## 2020-04-21 DIAGNOSIS — G4733 Obstructive sleep apnea (adult) (pediatric): Secondary | ICD-10-CM

## 2020-04-21 MED ORDER — LAMOTRIGINE 150 MG PO TABS: ORAL_TABLET | ORAL | 1 refills | Status: DC

## 2020-04-21 MED ORDER — PROPRANOLOL HCL 20 MG PO TABS: ORAL_TABLET | ORAL | 1 refills | Status: DC

## 2020-04-21 MED ORDER — CLOZAPINE 100 MG PO TABS: 600.0000 mg | ORAL_TABLET | Freq: Every day | ORAL | 5 refills | Status: DC

## 2020-04-21 MED ORDER — FLUOXETINE HCL 20 MG PO CAPS: ORAL_CAPSULE | ORAL | 1 refills | Status: DC

## 2020-04-21 MED ORDER — MODAFINIL 200 MG PO TABS: ORAL_TABLET | ORAL | 5 refills | Status: DC

## 2020-04-21 NOTE — Progress Notes (Signed)
[name] [id number] 10-Jan-1976 45 y.o.    Subjective:   Patient ID:  Matthew Reed is a 45 y.o. (DOB 25-Nov-1975) male.  Chief Complaint:  Chief Complaint  Patient presents with  . Follow-up  . Schizoaffective disorder, bipolar type ([place])  . Medication Problem  . Anxiety     HPI  Matthew Reed presents to the office today for follow-up of smoking and schizoaffective disorder, MCI and Se meds.  seen August 2020.  He wanted to have a trial of Wellbutrin for smoking cessation that was initiated 150 mg in the morning to then increase to 300 mg, the usual dose.  Patient called October 5 with the following complaint:Pt is having pretty bad side effects he thinks its from the Lithium he is on. Pt's leg start twitching then he falls down. He said its been going on for awhile, but has got worse.  He was given the following response from our office: Patient's complaints are most consistent with myoclonic jerks.  These can occur with lithium.  They can also occur with clozapine.  They are more common when clozapine dosages are above 600 mg daily and the patient is taking 700 mg daily. Serum lithium level was 1.0.  Serum BMP is within normal limits including creatinine 1.26 calcium 9.1.  TSH is also normal at 3.1 His lithium level is not high.  Usually lithium related jerks do not lead to falls.  It is more likely this is related to clozapine.  The treatment for clozapine related jerks is lamotrigine. The last note from the patient states that he has already reduced the lithium by half tablet daily.  If he has done that for over a week then the lithium is not the cause and we need to start the lamotrigine.  We cannot reduce the lithium further.  It typically takes about 1 week for reduction in lithium dosage to lead to reduction in side effects.  If it has been 1 full week then it is necessary to start the low-dose lamotrigine as noted below.  If it has been less than 1 week he can wait  until it has been 1 week and then notify us. Because it is a Friday I will go ahead and send in the prescription for lamotrigine and he can choose to start it now or defer until he has been on the lower dose of lithium for 1 full week.  Instructions on lamotrigine dosing will be on the prescription.  TC Reduced lithium to 2 daily about 5 days and the jerks are improved.  [place] a couple of times.  Better now.   Has reduced the clozapine from 700 to 600 mg daily. He's reduced his smoking by 1/2 or more.  This will tend to increase the blood clozapine. If the sx continue mid next week then call and may require checking blood level of clozapine or adding lamotrigine.  Wellbutrin has helped reduce the smoking. There are several issues that could be going on here.  Both lithium and clozapine can clear trigger myoclonic jerks.  He is already reduced the lithium.  He is also reduced to clozapine but he is also reduce smoking which will tend to increase the clozapine blood level.  Clozapine levels above 600 mg a day can trigger myoclonic jerks and potentially dose-related seizures.  Often lamotrigine is used to counteract this problem.  However there have been so many changes lately that it seems prudent to wait until next week once  the lithium levels are more stable in order to better evaluate the situation.  If the jerks continue then we will probably check a blood level of clozapine and also add lamotrigine. If jerks continue then call next week.  He agrees to this plan.  His lithium level in October before he reduced lithium was 1.0 Since that appointment he has reduced both lithium and clozapine because of jerks.   seen December 04, 2018 and the following was discussed. He just increased the lamotrigine to 100 early November and hasn't seen changes with the jerks.  They are scary bc have caused falls 3-4 times since here but last time was bc tripped.  Legs and arms will jerk.  Less tremor lately  with hands.  Legs will get week and shake and not feel strong enough to carry him.  If he tries to lift something over 20# it will happen.  No unusual back problems.  Hx sciatica resolved. Reduced smoking from 40 to 10 daily with Wellbutrin 150 helping.    OK trial Wellbutrin XL  300 mg AM retrial for smoking cessation DT failure of Chantix and nicotine patches.  Disc SE in detail.  Discussed the risk that this could increasing's anxiety or cause paranoia or mania.  Because of the severity of risk of smoking to his overall health it is reasonable to try this. Disc DDI clozapine and smoking.  Evidence suggests 40% lower levels in smokers which may require reducing the clozapine as he quits smoking.  He has reduced from 700 to 500 mg in the last week.   OK reduce clozapine to 400 mg daily.  Disc relapse risk.  He called December 1 reporting the leg jerks were better but not gone and wanted an increase to low lamotrigine to try to help with this problem.  It was increased from 100 to 150 mg daily at that time.  seen January 20, 2019. No problems are worse with reduction in clozapine to 400 mg and sleepiness is better.  Still tends to worry over things too much.  Paranoia still controlled. No mood swings.   Cigarettes 10-12 daily.  Wellbutrin helped him cut back.  Wants to stop. Jerks are 80-90% better and mainly when doing heavy lifting. He wanted to increase the Wellbutrin XL to 450 mg daily to see if he could completely stop smoking and that dose change was made.  03/18/2019 appointment the following is noted: Started having more paranoia and increased clozapine to 500 mg and the paranoia resolved. Forgot to increase Wellbutrin and still smoking 7-10 days.  Working PT and it's good.  Just a few hours weekly.  Gets fatigued if does it too much. Jerks are 80% better and asks about increasing lamotrigine from 225 mg AM. Still preaches and tolerated critique.  No paranoia.  Mood stable.  No mania.  Cut  hours PT at work bc too stressed and that helped.  Patient reports stable mood and denies depressed or irritable moods.   Patient denies difficulty with sleep initiation or maintenance. Sleep 11-1028. Using BiPap. Denies appetite disturbance.  Patient reports that energy and motivation have been good.  Patient denies any difficulty with concentration.  Patient denies any suicidal ideation. Asks about increasing Wellbutrin to the max to try stop smoking completely.  He is aware of the risk of it causing more anxiety and paranoia.   Plan: Increase Wellbutrin XL to 450 mg daily to maximize benefit for smoking cessation. Increase lamotrigine to 2 daily to  help with jerks.  06/17/2019 appointment the following is noted: Took Wellbutrin 450 mg for awhile and was cranky, moody and paranoid and stopped and it resolved off of it. It did help with smoking.  Increased smoking caused increased paranoia and needed to increase clozapine to 600 mg daily from 500 mg daily. Some PT work stressed dealing with the public but usually can handle it if not around people so much. Sleep OK with bipap with good reading.  Machine is 46 years old. Still occ speaks at church. Jerking is a lot better but it still happens.  No falls. Propranolol helps anxiety and tremor. Plan: For the jerks increase lamotrigine to 2 tablets in the morning. Increase Wellbutrin to 3 of the 150 mg tablets each morning. He forgot to do this last visit.  10/21/19 appt noted: Couldn't tolerate Wellbutrin DT moodiness and stopped. CO OCD anxiety compulsively checks for keys and schedule bc fear of losing or forgetting things.  Hides his checking from others.  Occ may have to recheck but obsesses on it.  Wife doesn't notice his OCD.   Anxiety is not too bad otherwise.  Not paranoid.  Not depressed.   Patient reports stable mood and denies depressed or irritable moods.  Patient denies any recent difficulty with anxiety.  Patient denies difficulty with  sleep initiation or maintenance. Denies appetite disturbance.  Patient reports that energy and motivation have been good.  Patient denies any difficulty with concentration.  Patient denies any suicidal ideation. Grinding teeth he thinks at night.   No problems with other meds.   Less leg twitches now.   Gets tired about 2-3 in afternoon. Plan: Consider afternoon modafinil. Yes, 200 mg in AM and 100 mg in PM Wellbutrin made him moody and he had to stop it.  02/22/2020 appointment with following noted: OCD is getting worse.  Repeated checking rituals around eating, not sure about anything.  So frustrating.  Checks lights when driving.   No reason for it to be worse unless stress but doesn't feel more stressed out. Fatigue and wants thyroid checked.  No mood swings.  No mania.  Plan start sertraline up to 100 mg daily for OCD type sx.  04/21/2020 appt noted: Sertraline 100 worked a little for OCD but affecting sex function and concerned about it. Everything else is great. Not having to check things as much but still does.  Still worries.  Does he have keys or schedule.  Doubts about what he is doing.   Taking energy shot drinks bc feels tired. Sleep doc pleased with bipap with good response. Not jittery with energy drinks and doesn't affect sleep. Increase modafinil to 300 mg daily.   Kids  70, 47, 13 yo  He reduced the clozapine dose and 2017 and gradually had worsening anxiety and paranoia at 500 mg daily.  Past Psychiatric Medication Trials:  On clozapine 700 since July 2018 to August 2020.  Chantix N and irritable and tired.  Tried twice.  Wellbutrin 300 Haldol dystonia  olanzapine for many years, ziprasidone, risperidone,  benzodiazepine dependence remotely,  modafinil,  propranolol,  fluvoxamine,  Sertraline 100 Aricept no response, Exelon partial response, Nuvigil, Provigil Remote history of abuse of CBZ  Review of Systems:  Review of Systems  Constitutional: Positive for  fatigue.  Cardiovascular: Negative for palpitations.  Genitourinary:       ED and ejac delay  Neurological: Negative for tremors and weakness.       Myoclonic jerks not evident in  the office but reported.  Psychiatric/Behavioral: Negative for agitation, behavioral problems, confusion, decreased concentration, dysphoric mood, hallucinations, self-injury, sleep disturbance and suicidal ideas. The patient is nervous/anxious. The patient is not hyperactive.   Not usually short of breath.  Medications: I have reviewed the patient's current medications.  Current Outpatient Medications  Medication Sig Dispense Refill  . cloZAPine (CLOZARIL) 100 MG tablet Take 6 tablets (600 mg total) by mouth daily. 42 tablet 0  . lamoTRIgine (LAMICTAL) 150 MG tablet TAKE 2 TABLETS(300 MG) BY MOUTH DAILY 180 tablet 1  . lithium carbonate (ESKALITH) 450 MG CR tablet TAKE 1 TABLET(450 MG) BY MOUTH TWICE DAILY 60 tablet 2  . modafinil (PROVIGIL) 200 MG tablet 1 in the AM and 1/2 at 2 PM. 45 tablet 5  . propranolol (INDERAL) 20 MG tablet TAKE 1 TO 3 TABLETS BY MOUTH TWICE DAILY 540 tablet 0  . sertraline (ZOLOFT) 100 MG tablet 1/2 daily for 1 week then 1 daily (Patient taking differently: 1 tablet daily) 30 tablet 1   No current facility-administered medications for this visit.    Medication Side Effect: fatigue  Allergies: No Known Allergies  Past Medical History:  Diagnosis Date  . Schizoaffective disorder ([place])     Family History  Problem Relation Age of Onset  . Kidney cancer Mother   . Diabetes Mother   . Hypertension Mother   . Hyperlipidemia Mother   . Hypertension Father     Social History   Socioeconomic History  . Marital status: Married    Spouse name: Not on file  . Number of children: Not on file  . Years of education: Not on file  . Highest education level: Not on file  Occupational History  . 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  Tobacco Use  . Smoking status:  Current Every Day Smoker    Packs/day: 1.00    Years: 25.00    Pack years: 25.00    Types: Cigarettes  . Smokeless tobacco: Never Used  . Tobacco comment: smokes 1.5 packs per day 04/18/2020  Vaping Use  . Vaping Use: Never used  Substance and Sexual Activity  . Alcohol use: No  . Drug use: No  . Sexual activity: Not on file  Other Topics Concern  . Not on file  Social History Narrative  . Not on file   Social Determinants of Health   Financial Resource Strain: Not on file  Food Insecurity: Not on file  Transportation Needs: Not on file  Physical Activity: Not on file  Stress: Not on file  Social Connections: Not on file  Intimate Partner Violence: Not on file    Past Medical History, Surgical history, Social history, and Family history were reviewed and updated as appropriate.   Please see review of systems for further details on the patient's review from today.   Objective:   Physical Exam:  There were no vitals taken for this visit.  Physical Exam Constitutional:      General: He is not in acute distress.    Appearance: He is well-developed.  Musculoskeletal:        General: No deformity.  Neurological:     Mental Status: He is alert and oriented to person, place, and time.     Motor: No tremor.     Coordination: Coordination normal.     Gait: Gait normal.  Psychiatric:        Attention and Perception: Attention and perception normal. He is attentive.  Mood and Affect: Mood is anxious. Mood is not depressed. Affect is not labile, blunt, angry or inappropriate.        Speech: Speech normal.        Behavior: Behavior normal.        Thought Content: Thought content normal. Thought content is not paranoid. Thought content does not include homicidal or suicidal ideation. Thought content does not include homicidal or suicidal plan.        Cognition and Memory: Cognition normal.        Judgment: Judgment normal.     Comments: Insight is good. OCD is partially  responsive to sertraline 100     Lab Review:     Component Value Date/Time   NA 141 03/09/2007 2256   K 4.1 03/09/2007 2256   CL 105 03/09/2007 2256   CO2 28 03/09/2007 2256   GLUCOSE 107 (H) 03/09/2007 2256   BUN 7 03/09/2007 2256   CREATININE 0.94 03/09/2007 2256   CALCIUM 9.5 03/09/2007 2256   GFRNONAA >60 03/09/2007 2256   GFRAA  03/09/2007 2256    >60        The eGFR has been calculated using the MDRD equation. This calculation has not been validated in all clinical       Component Value Date/Time   WBC 6.1 01/04/2020 0853   WBC 4.7 03/09/2007 2256   RBC 5.28 01/04/2020 0853   RBC 5.06 03/09/2007 2256   HGB 15.7 01/04/2020 0853   HCT 47.0 01/04/2020 0853   PLT 171 01/04/2020 0853   MCV 89 01/04/2020 0853   MCH 29.7 01/04/2020 0853   MCHC 33.4 01/04/2020 0853   MCHC 35.3 03/09/2007 2256   RDW 13.4 01/04/2020 0853   LYMPHSABS 1.7 01/04/2020 0853   MONOABS 0.4 03/09/2007 2256   EOSABS 0.0 01/04/2020 0853   BASOSABS 0.1 01/04/2020 0853    No results found for: POCLITH, LITHIUM   Lab Results  Component Value Date   VALPROATE 78.4 03/15/2007     .res Assessment: Plan:    Schizoaffective disorder, bipolar type ([place])  Mixed obsessional thoughts and acts  Social anxiety disorder  Generalized anxiety disorder  Obstructive sleep apnea  Myoclonic jerking  Lithium use  Nicotine dependence with withdrawal, unspecified nicotine product type  Long term current use of clozapine   He failed to tolerate even the lower dose of Chantix.  Though it was helpful.  His schizoaffective disorder symptoms including paranoia and irritability are well controlled with the current medications.  Including the clozapine.  His CBC with differential has been stable and unremarkable.  These have been recorded in the paper chart because it is difficult to send the results to the pharmacy and our REMS through epic we discussed the side effects of clozapine in detail.    We  have tried to mitigate excessive tiredness from clozapine that through the use of modafinil or Nuvigil with partial success.  He's using CPAP regularly. Dr. Halford Chessman checked it out.  Sertraline helped OCD some but caused sex SE.  He has mild cognitive impairment is not that severe.  Sertraline helped OCD some but sexual SE.  Concerns about DDI with clozapine from fluvoxamine and fluoxetine which both have probably less sex SE.  Least risk probably with fluoxetine so will switch to it.  Disc Ddi and risk sedation. Switch sertraline to fluoxetine 40 mg daily  Reduce sertraline to 1/2 tablet and start fluoxetine 1of the 20 mg capsule daily for 1 week. Then increase fluoxetine  to 2 capsules daily and stop sertraline.  He is having myoclonic jerks in legs likely from clozapine.   Myoclonic jerks are better but not gone and he wants trial of higher dose.  Increase to 300 mg daily.  No changes For the jerks continue lamotrigine to 2 tablets in the morning.  It appears to be helping.  ANC levels are within normal limits.  Options for labs [facility] or [facility]  Check TSH and lithium level DT Fatigue.  He and this office are having difficulties dealing with his current lab which is an independent laboratory that does not send results in epic.  Prefer to avoid adding meds for OCD if possible DT risk destabilizing but it's worse and h's willing to accept the risk..  Continue modafinil. Which helps a little. 200 mg in AM and 100 mg in PM Consider switch to [facility].  Disc DDI clozapine and smoking.  Evidence suggests 40% lower levels in smokers which may require reducing the clozapine as he quits smoking.  He has reduced from 700 to 500 mg in the last few months Continue clozapine to 500 mg daily.  Disc relapse risk.  His paranoia had worsened at 400 mg daily.  It resolved again when he increase the dose back to 500 mg daily. We discussed the risk of worsening paranoia if we reduce the dosage.  He is willing to  tolerate this risk.  Follow-up 2 mos  Lynder Parents, MD, DFAPA   No future appointments.  No orders of the defined types were placed in this encounter.     -------------------------------

## 2020-04-21 NOTE — Patient Instructions (Signed)
Reduce sertraline to 1/2 tablet and start fluoxetine 1of the 20 mg capsule daily for 1 week. Then increase fluoxetine to 2 capsules daily and stop sertraline.

## 2020-04-23 ENCOUNTER — Other Ambulatory Visit: Payer: Self-pay | Admitting: Psychiatry

## 2020-04-23 DIAGNOSIS — F4010 Social phobia, unspecified: Secondary | ICD-10-CM

## 2020-04-23 DIAGNOSIS — F422 Mixed obsessional thoughts and acts: Secondary | ICD-10-CM

## 2020-04-23 DIAGNOSIS — F411 Generalized anxiety disorder: Secondary | ICD-10-CM

## 2020-04-25 DIAGNOSIS — Z79899 Other long term (current) drug therapy: Secondary | ICD-10-CM | POA: Diagnosis not present

## 2020-04-25 DIAGNOSIS — F250 Schizoaffective disorder, bipolar type: Secondary | ICD-10-CM | POA: Diagnosis not present

## 2020-04-26 ENCOUNTER — Encounter: Payer: Self-pay | Admitting: Psychiatry

## 2020-05-17 ENCOUNTER — Telehealth: Payer: Self-pay | Admitting: Psychiatry

## 2020-05-17 NOTE — Telephone Encounter (Signed)
He said he will try 500 mg again.

## 2020-05-17 NOTE — Telephone Encounter (Signed)
He is currently taking clozapine 600 mg in the evening. He reduced the clozapine dose in 2017 and gradually had worsening anxiety and paranoia at 500 mg daily.  However if he is willing to take that risk he can reduce the clozapine to 500 mg at night.

## 2020-05-17 NOTE — Telephone Encounter (Signed)
Pt called and said that the clozapine is still making him tired. He is having a hard time stayin g awake during the day. He would like to decrease this medicine. Please give him a call at [phone].

## 2020-05-17 NOTE — Telephone Encounter (Signed)
Please review

## 2020-05-17 NOTE — Telephone Encounter (Signed)
reviewed

## 2020-05-20 ENCOUNTER — Other Ambulatory Visit: Payer: Self-pay | Admitting: Psychiatry

## 2020-05-20 DIAGNOSIS — G253 Myoclonus: Secondary | ICD-10-CM

## 2020-05-23 DIAGNOSIS — F250 Schizoaffective disorder, bipolar type: Secondary | ICD-10-CM | POA: Diagnosis not present

## 2020-05-23 DIAGNOSIS — Z79899 Other long term (current) drug therapy: Secondary | ICD-10-CM | POA: Diagnosis not present

## 2020-06-19 ENCOUNTER — Other Ambulatory Visit: Payer: Self-pay | Admitting: Psychiatry

## 2020-06-22 ENCOUNTER — Other Ambulatory Visit: Payer: Self-pay | Admitting: Psychiatry

## 2020-06-22 ENCOUNTER — Ambulatory Visit (INDEPENDENT_AMBULATORY_CARE_PROVIDER_SITE_OTHER): Payer: Medicare Other | Admitting: Psychiatry

## 2020-06-22 ENCOUNTER — Encounter: Payer: Self-pay | Admitting: Psychiatry

## 2020-06-22 ENCOUNTER — Other Ambulatory Visit: Payer: Self-pay

## 2020-06-22 DIAGNOSIS — F4010 Social phobia, unspecified: Secondary | ICD-10-CM

## 2020-06-22 DIAGNOSIS — Z79899 Other long term (current) drug therapy: Secondary | ICD-10-CM

## 2020-06-22 DIAGNOSIS — F411 Generalized anxiety disorder: Secondary | ICD-10-CM | POA: Diagnosis not present

## 2020-06-22 DIAGNOSIS — F422 Mixed obsessional thoughts and acts: Secondary | ICD-10-CM | POA: Diagnosis not present

## 2020-06-22 DIAGNOSIS — G4733 Obstructive sleep apnea (adult) (pediatric): Secondary | ICD-10-CM | POA: Diagnosis not present

## 2020-06-22 DIAGNOSIS — G253 Myoclonus: Secondary | ICD-10-CM | POA: Diagnosis not present

## 2020-06-22 DIAGNOSIS — F250 Schizoaffective disorder, bipolar type: Secondary | ICD-10-CM

## 2020-06-22 DIAGNOSIS — F0390 Unspecified dementia without behavioral disturbance: Secondary | ICD-10-CM

## 2020-06-22 DIAGNOSIS — G3184 Mild cognitive impairment, so stated: Secondary | ICD-10-CM

## 2020-06-22 MED ORDER — MODAFINIL 200 MG PO TABS: ORAL_TABLET | ORAL | 2 refills | Status: DC

## 2020-06-22 MED ORDER — CLOZAPINE 100 MG PO TABS: 600.0000 mg | ORAL_TABLET | Freq: Every day | ORAL | 5 refills | Status: DC

## 2020-06-22 NOTE — Progress Notes (Signed)
Matthew Reed [id number] Jan 24, 1975 45 y.o.    Subjective:   Patient ID:  Matthew Reed is a 45 y.o. (DOB 01-10-1976) male.  Chief Complaint:  Chief Complaint  Patient presents with  . Follow-up  . Schizoaffective disorder, bipolar type ([place])  . Medication Problem  . Fatigue  . Memory Loss     HPI  Matthew Reed presents to the office today for follow-up of smoking and schizoaffective disorder, MCI and Se meds.  seen August 2020.  He wanted to have a trial of Wellbutrin for smoking cessation that was initiated 150 mg in the morning to then increase to 300 mg, the usual dose.  Patient called October 5 with the following complaint:Pt is having pretty bad side effects he thinks its from the Lithium he is on. Pt's leg start twitching then he falls down. He said its been going on for awhile, but has got worse.  He was given the following response from our office: Patient's complaints are most consistent with myoclonic jerks.  These can occur with lithium.  They can also occur with clozapine.  They are more common when clozapine dosages are above 600 mg daily and the patient is taking 700 mg daily. Serum lithium level was 1.0.  Serum BMP is within normal limits including creatinine 1.26 calcium 9.1.  TSH is also normal at 3.1 His lithium level is not high.  Usually lithium related jerks do not lead to falls.  It is more likely this is related to clozapine.  The treatment for clozapine related jerks is lamotrigine. The last note from the patient states that he has already reduced the lithium by half tablet daily.  If he has done that for over a week then the lithium is not the cause and we need to start the lamotrigine.  We cannot reduce the lithium further.  It typically takes about 1 week for reduction in lithium dosage to lead to reduction in side effects.  If it has been 1 full week then it is necessary to start the low-dose lamotrigine as noted below.  If it has been less than 1 week  he can wait until it has been 1 week and then notify us. Because it is a Friday I will go ahead and send in the prescription for lamotrigine and he can choose to start it now or defer until he has been on the lower dose of lithium for 1 full week.  Instructions on lamotrigine dosing will be on the prescription.  TC Reduced lithium to 2 daily about 5 days and the jerks are improved.  [place] a couple of times.  Better now.   Has reduced the clozapine from 700 to 600 mg daily. He's reduced his smoking by 1/2 or more.  This will tend to increase the blood clozapine. If the sx continue mid next week then call and may require checking blood level of clozapine or adding lamotrigine.  Wellbutrin has helped reduce the smoking. There are several issues that could be going on here.  Both lithium and clozapine can clear trigger myoclonic jerks.  He is already reduced the lithium.  He is also reduced to clozapine but he is also reduce smoking which will tend to increase the clozapine blood level.  Clozapine levels above 600 mg a day can trigger myoclonic jerks and potentially dose-related seizures.  Often lamotrigine is used to counteract this problem.  However there have been so many changes lately that it seems prudent to wait  until next week once the lithium levels are more stable in order to better evaluate the situation.  If the jerks continue then we will probably check a blood level of clozapine and also add lamotrigine. If jerks continue then call next week.  He agrees to this plan.  His lithium level in October before he reduced lithium was 1.0 Since that appointment he has reduced both lithium and clozapine because of jerks.   seen December 04, 2018 and the following was discussed. He just increased the lamotrigine to 100 early November and hasn't seen changes with the jerks.  They are scary bc have caused falls 3-4 times since here but last time was bc tripped.  Legs and arms will jerk.  Less  tremor lately with hands.  Legs will get week and shake and not feel strong enough to carry him.  If he tries to lift something over 20# it will happen.  No unusual back problems.  Hx sciatica resolved. Reduced smoking from 40 to 10 daily with Wellbutrin 150 helping.    OK trial Wellbutrin XL  300 mg AM retrial for smoking cessation DT failure of Chantix and nicotine patches.  Disc SE in detail.  Discussed the risk that this could increasing's anxiety or cause paranoia or mania.  Because of the severity of risk of smoking to his overall health it is reasonable to try this. Disc DDI clozapine and smoking.  Evidence suggests 40% lower levels in smokers which may require reducing the clozapine as he quits smoking.  He has reduced from 700 to 500 mg in the last week.   OK reduce clozapine to 400 mg daily.  Disc relapse risk.  He called December 1 reporting the leg jerks were better but not gone and wanted an increase to low lamotrigine to try to help with this problem.  It was increased from 100 to 150 mg daily at that time.  seen January 20, 2019. No problems are worse with reduction in clozapine to 400 mg and sleepiness is better.  Still tends to worry over things too much.  Paranoia still controlled. No mood swings.   Cigarettes 10-12 daily.  Wellbutrin helped him cut back.  Wants to stop. Jerks are 80-90% better and mainly when doing heavy lifting. He wanted to increase the Wellbutrin XL to 450 mg daily to see if he could completely stop smoking and that dose change was made.  03/18/2019 appointment the following is noted: Started having more paranoia and increased clozapine to 500 mg and the paranoia resolved. Forgot to increase Wellbutrin and still smoking 7-10 days.  Working PT and it's good.  Just a few hours weekly.  Gets fatigued if does it too much. Jerks are 80% better and asks about increasing lamotrigine from 225 mg AM. Still preaches and tolerated critique.  No paranoia.  Mood stable.  No  mania.  Cut hours PT at work bc too stressed and that helped.  Patient reports stable mood and denies depressed or irritable moods.   Patient denies difficulty with sleep initiation or maintenance. Sleep 11-1028. Using BiPap. Denies appetite disturbance.  Patient reports that energy and motivation have been good.  Patient denies any difficulty with concentration.  Patient denies any suicidal ideation. Asks about increasing Wellbutrin to the max to try stop smoking completely.  He is aware of the risk of it causing more anxiety and paranoia.   Plan: Increase Wellbutrin XL to 450 mg daily to maximize benefit for smoking cessation. Increase lamotrigine  to 2 daily to help with jerks.  06/17/2019 appointment the following is noted: Took Wellbutrin 450 mg for awhile and was cranky, moody and paranoid and stopped and it resolved off of it. It did help with smoking.  Increased smoking caused increased paranoia and needed to increase clozapine to 600 mg daily from 500 mg daily. Some PT work stressed dealing with the public but usually can handle it if not around people so much. Sleep OK with bipap with good reading.  Machine is 46 years old. Still occ speaks at church. Jerking is a lot better but it still happens.  No falls. Propranolol helps anxiety and tremor. Plan: For the jerks increase lamotrigine to 2 tablets in the morning. Increase Wellbutrin to 3 of the 150 mg tablets each morning. He forgot to do this last visit.  10/21/19 appt noted: Couldn't tolerate Wellbutrin DT moodiness and stopped. CO OCD anxiety compulsively checks for keys and schedule bc fear of losing or forgetting things.  Hides his checking from others.  Occ may have to recheck but obsesses on it.  Wife doesn't notice his OCD.   Anxiety is not too bad otherwise.  Not paranoid.  Not depressed.   Patient reports stable mood and denies depressed or irritable moods.  Patient denies any recent difficulty with anxiety.  Patient denies  difficulty with sleep initiation or maintenance. Denies appetite disturbance.  Patient reports that energy and motivation have been good.  Patient denies any difficulty with concentration.  Patient denies any suicidal ideation. Grinding teeth he thinks at night.   No problems with other meds.   Less leg twitches now.   Gets tired about 2-3 in afternoon. Plan: Consider afternoon modafinil. Yes, 200 mg in AM and 100 mg in PM Wellbutrin made him moody and he had to stop it.  02/22/2020 appointment with following noted: OCD is getting worse.  Repeated checking rituals around eating, not sure about anything.  So frustrating.  Checks lights when driving.   No reason for it to be worse unless stress but doesn't feel more stressed out. Fatigue and wants thyroid checked.  No mood swings.  No mania.  Plan start sertraline up to 100 mg daily for OCD type sx.  04/21/2020 appt noted: Sertraline 100 worked a little for OCD but affecting sex function and concerned about it. Everything else is great. Not having to check things as much but still does.  Still worries.  Does he have keys or schedule.  Doubts about what he is doing.   Taking energy shot drinks bc feels tired. Sleep doc pleased with bipap with good response. Not jittery with energy drinks and doesn't affect sleep. Increase modafinil to 300 mg daily. Plan: Sertraline helped OCD some but sexual SE.  Concerns about DDI with clozapine from fluvoxamine and fluoxetine which both have probably less sex SE.  Least risk probably with fluoxetine so will switch to it.  Disc Ddi and risk sedation. Switch sertraline to fluoxetine 40 mg daily  Reduce sertraline to 1/2 tablet and start fluoxetine 1of the 20 mg capsule daily for 1 week. Then increase fluoxetine to 2 capsules daily and stop sertraline.   05/17/2020 phone call: Patient complaining that clozapine was making him sick excessively sleepy. MD response: He is currently taking clozapine 600 mg in the  evening. He reduced the clozapine dose in 2017 and gradually had worsening anxiety and paranoia at 500 mg daily.  However if he is willing to take that risk he can reduce  the clozapine to 500 mg at night.  06/22/2020 appointment with the following noted: Still OCD sx not any better and no different from when on sertraline.  Checks things repeatedly, schedule locks.   Also still drowsy after reducing the clozapine to 500 mg at night. Seen with wife who said there were couple of instances of paranoia at work.  He mentioned to her a week ago.  She says day to day he seems ok.   Levada Dy concerned about his memory.  He can't remember things and it wears her out.  She has to repeat instructions to him repeatedly.  Can't depend on him to keep up with stuff.   Pulm saw him and his AHI was 0.3. She sees general cognitive decline.  Kids  16, 80, 36 yo  He reduced the clozapine dose and 2017 and gradually had worsening anxiety and paranoia at 500 mg daily.  Past Psychiatric Medication Trials:  On clozapine 700 since July 2018 to August 2020.  Chantix N and irritable and tired.  Tried twice.  Wellbutrin 300 Haldol dystonia  olanzapine for many years, ziprasidone, risperidone,  benzodiazepine dependence remotely,  modafinil,  propranolol,  fluvoxamine,  Sertraline 100 Aricept no response, Exelon partial response, Nuvigil, Provigil Remote history of abuse of CBZ  Review of Systems:  Review of Systems  Constitutional: Positive for fatigue.  Cardiovascular: Negative for palpitations.  Genitourinary:       ED and ejac delay  Neurological: Negative for tremors and weakness.       Myoclonic jerks not evident in the office but reported.  Psychiatric/Behavioral: Positive for decreased concentration. Negative for agitation, behavioral problems, confusion, dysphoric mood, hallucinations, self-injury, sleep disturbance and suicidal ideas. The patient is nervous/anxious. The patient is not hyperactive.   Not  usually short of breath.  Medications: I have reviewed the patient's current medications.  Current Outpatient Medications  Medication Sig Dispense Refill  . FLUoxetine (PROZAC) 20 MG capsule 1 daily for 1 week then 2 daily (Patient taking differently: 2 daily) 60 capsule 1  . lamoTRIgine (LAMICTAL) 150 MG tablet TAKE 2 TABLETS(300 MG) BY MOUTH DAILY 180 tablet 1  . lithium carbonate (ESKALITH) 450 MG CR tablet TAKE 1 TABLET(450 MG) BY MOUTH TWICE DAILY 60 tablet 2  . propranolol (INDERAL) 20 MG tablet 2-3 tablets twice daily for tremor 540 tablet 1  . cloZAPine (CLOZARIL) 100 MG tablet Take 6 tablets (600 mg total) by mouth daily. 180 tablet 5  . modafinil (PROVIGIL) 200 MG tablet 1 in the AM and 1 at 2 PM. 60 tablet 2   No current facility-administered medications for this visit.    Medication Side Effect: fatigue  Allergies: No Known Allergies  Past Medical History:  Diagnosis Date  . Schizoaffective disorder ([place])     Family History  Problem Relation Age of Onset  . Kidney cancer Mother   . Diabetes Mother   . Hypertension Mother   . Hyperlipidemia Mother   . Hypertension Father     Social History   Socioeconomic History  . Marital status: Married    Spouse name: Not on file  . Number of children: Not on file  . Years of education: Not on file  . Highest education level: Not on file  Occupational History  . 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  Tobacco Use  . Smoking status: Current Every Day Smoker    Packs/day: 1.00    Years: 25.00    Pack years: 25.00  Types: Cigarettes  . Smokeless tobacco: Never Used  . Tobacco comment: smokes 1.5 packs per day 04/18/2020  Vaping Use  . Vaping Use: Never used  Substance and Sexual Activity  . Alcohol use: No  . Drug use: No  . Sexual activity: Not on file  Other Topics Concern  . Not on file  Social History Narrative  . Not on file   Social Determinants of Health   Financial Resource Strain:  Not on file  Food Insecurity: Not on file  Transportation Needs: Not on file  Physical Activity: Not on file  Stress: Not on file  Social Connections: Not on file  Intimate Partner Violence: Not on file    Past Medical History, Surgical history, Social history, and Family history were reviewed and updated as appropriate.   Please see review of systems for further details on the patient's review from today.   Objective:   Physical Exam:  There were no vitals taken for this visit.  Physical Exam Constitutional:      General: He is not in acute distress.    Appearance: He is well-developed.  Musculoskeletal:        General: No deformity.  Neurological:     Mental Status: He is alert and oriented to person, place, and time.     Motor: No tremor.     Coordination: Coordination normal.     Gait: Gait normal.  Psychiatric:        Attention and Perception: Attention and perception normal. He is attentive.        Mood and Affect: Mood is anxious. Mood is not depressed. Affect is not labile, blunt, angry or inappropriate.        Speech: Speech normal.        Behavior: Behavior normal.        Thought Content: Thought content normal. Thought content is not paranoid. Thought content does not include homicidal or suicidal ideation. Thought content does not include homicidal or suicidal plan.        Cognition and Memory: Memory is impaired. He exhibits impaired recent memory.        Judgment: Judgment normal.     Comments: Insight is good. OCD is unchanged     Lab Review:     Component Value Date/Time   NA 141 03/09/2007 2256   K 4.1 03/09/2007 2256   CL 105 03/09/2007 2256   CO2 28 03/09/2007 2256   GLUCOSE 107 (H) 03/09/2007 2256   BUN 7 03/09/2007 2256   CREATININE 0.94 03/09/2007 2256   CALCIUM 9.5 03/09/2007 2256   GFRNONAA >60 03/09/2007 2256   GFRAA  03/09/2007 2256    >60        The eGFR has been calculated using the MDRD equation. This calculation has not  been validated in all clinical       Component Value Date/Time   WBC 6.1 01/04/2020 0853   WBC 4.7 03/09/2007 2256   RBC 5.28 01/04/2020 0853   RBC 5.06 03/09/2007 2256   HGB 15.7 01/04/2020 0853   HCT 47.0 01/04/2020 0853   PLT 171 01/04/2020 0853   MCV 89 01/04/2020 0853   MCH 29.7 01/04/2020 0853   MCHC 33.4 01/04/2020 0853   MCHC 35.3 03/09/2007 2256   RDW 13.4 01/04/2020 0853   LYMPHSABS 1.7 01/04/2020 0853   MONOABS 0.4 03/09/2007 2256   EOSABS 0.0 01/04/2020 0853   BASOSABS 0.1 01/04/2020 0853    No results found for: POCLITH, LITHIUM  Lab Results  Component Value Date   VALPROATE 78.4 03/15/2007     .res Assessment: Plan:    Schizoaffective disorder, bipolar type ([place]) - Plan: cloZAPine (CLOZARIL) 100 MG tablet  Mixed obsessional thoughts and acts  Social anxiety disorder  Generalized anxiety disorder  Obstructive sleep apnea - Plan: modafinil (PROVIGIL) 200 MG tablet  Myoclonic jerking  Lithium use  Long term current use of clozapine  Mild cognitive impairment - Plan: CANCELED: B12 and Folate Panel  Dementia without behavioral disturbance, unspecified dementia type ([place])  - Plan: CANCELED: B12 and Folate Panel   He failed to tolerate even the lower dose of Chantix.  Though it was helpful.  His schizoaffective disorder symptoms including paranoia and irritability are well controlled with the current medications.  Including the clozapine.  His CBC with differential has been stable and unremarkable.  These have been recorded in the paper chart because it is difficult to send the results to the pharmacy and our REMS through epic we discussed the side effects of clozapine in detail.    We have tried to mitigate excessive tiredness from clozapine that through the use of modafinil or Nuvigil with partial success. Try increase tgo 400 mg daily.  He's using CPAP regularly. Dr. Halford Chessman checked it out.  He has mild cognitive impairment is not that  severe.  Sertraline helped OCD some but sexual SE.  Concerns about DDI with clozapine from fluvoxamine and fluoxetine which both have probably less sex SE.  Least risk probably with fluoxetine so will switch to it.  Disc Ddi and risk sedation. Continue fluoxetine 40 mg daily  He is having myoclonic jerks in legs likely from clozapine.   Myoclonic jerks are better but not gone and he wants trial of higher dose.  Increase to 300 mg daily.  No changes For the jerks reduce lamotrigine to 1 and 1/2 tablets in the morning to see if cognition is better.  It appears to be helping.  ANC levels are within normal limits.  Options for labs [facility] or [facility]  Check TSH and lithium level DT Fatigue.  Done and is OK Check B12 and folate DT worsening cognition He and this office are having difficulties dealing with his current lab which is an independent laboratory that does not send results in epic.  Consider memantine for cognition if not better.  Continue modafinil. Which helps a little. 200 mg in AM and 100 mg in PM Consider switch to [facility].  Disc DDI clozapine and smoking.  Evidence suggests 40% lower levels in smokers which may require reducing the clozapine as he quits smoking.  He has reduced from 700 to 500 mg in the last few months Continue clozapine to 600 mg daily.  Disc relapse risk.  His paranoia had worsened at 400 mg daily.  It resolved again when he increase the dose back to 500 mg daily. We discussed the risk of worsening paranoia if we reduce the dosage.  He is willing to tolerate this risk.  Follow-up 2 mos  Lynder Parents, MD, DFAPA   No future appointments.  No orders of the defined types were placed in this encounter. Reduce lamotrigine to 1-1/2 tablets in the morning to see if it helps memory  Increase modafinil to 2 tablets daily  Check vitamin B12 and folate level    -------------------------------

## 2020-06-22 NOTE — Patient Instructions (Addendum)
Reduce lamotrigine to 1-1/2 tablets in the morning to see if it helps memory  Increase modafinil to 2 tablets daily  Check vitamin B12 and folate level

## 2020-06-30 DIAGNOSIS — G3184 Mild cognitive impairment, so stated: Secondary | ICD-10-CM | POA: Diagnosis not present

## 2020-07-05 ENCOUNTER — Encounter: Payer: Self-pay | Admitting: Psychiatry

## 2020-07-11 NOTE — Progress Notes (Signed)
We checked his B12 and folate level because of memory complaints.  Please let him or his wife know that both levels are within the normal range.

## 2020-07-12 NOTE — Progress Notes (Signed)
Pt informed

## 2020-07-19 DIAGNOSIS — F250 Schizoaffective disorder, bipolar type: Secondary | ICD-10-CM | POA: Diagnosis not present

## 2020-07-19 DIAGNOSIS — Z79899 Other long term (current) drug therapy: Secondary | ICD-10-CM | POA: Diagnosis not present

## 2020-07-22 ENCOUNTER — Encounter: Payer: Self-pay | Admitting: Psychiatry

## 2020-08-17 ENCOUNTER — Other Ambulatory Visit: Payer: Self-pay | Admitting: Psychiatry

## 2020-08-17 DIAGNOSIS — Z79899 Other long term (current) drug therapy: Secondary | ICD-10-CM | POA: Diagnosis not present

## 2020-08-17 DIAGNOSIS — F422 Mixed obsessional thoughts and acts: Secondary | ICD-10-CM

## 2020-08-17 DIAGNOSIS — F250 Schizoaffective disorder, bipolar type: Secondary | ICD-10-CM | POA: Diagnosis not present

## 2020-08-23 ENCOUNTER — Encounter: Payer: Self-pay | Admitting: Psychiatry

## 2020-09-13 DIAGNOSIS — F250 Schizoaffective disorder, bipolar type: Secondary | ICD-10-CM | POA: Diagnosis not present

## 2020-09-13 DIAGNOSIS — Z79899 Other long term (current) drug therapy: Secondary | ICD-10-CM | POA: Diagnosis not present

## 2020-09-14 ENCOUNTER — Other Ambulatory Visit: Payer: Self-pay | Admitting: Psychiatry

## 2020-09-14 DIAGNOSIS — F422 Mixed obsessional thoughts and acts: Secondary | ICD-10-CM

## 2020-09-15 ENCOUNTER — Ambulatory Visit: Payer: Medicare Other | Admitting: Psychiatry

## 2020-09-15 ENCOUNTER — Encounter: Payer: Self-pay | Admitting: *Deleted

## 2020-10-10 ENCOUNTER — Other Ambulatory Visit: Payer: Self-pay | Admitting: Psychiatry

## 2020-10-10 DIAGNOSIS — G4733 Obstructive sleep apnea (adult) (pediatric): Secondary | ICD-10-CM

## 2020-10-10 NOTE — Telephone Encounter (Signed)
Apt back 11/02

## 2020-10-11 ENCOUNTER — Encounter: Payer: Self-pay | Admitting: Psychiatry

## 2020-10-11 DIAGNOSIS — Z79899 Other long term (current) drug therapy: Secondary | ICD-10-CM | POA: Diagnosis not present

## 2020-10-11 DIAGNOSIS — F250 Schizoaffective disorder, bipolar type: Secondary | ICD-10-CM | POA: Diagnosis not present

## 2020-11-11 DIAGNOSIS — Z79899 Other long term (current) drug therapy: Secondary | ICD-10-CM | POA: Diagnosis not present

## 2020-11-11 DIAGNOSIS — F250 Schizoaffective disorder, bipolar type: Secondary | ICD-10-CM | POA: Diagnosis not present

## 2020-11-15 ENCOUNTER — Other Ambulatory Visit: Payer: Self-pay | Admitting: Psychiatry

## 2020-11-15 DIAGNOSIS — F422 Mixed obsessional thoughts and acts: Secondary | ICD-10-CM

## 2020-11-16 ENCOUNTER — Encounter: Payer: Self-pay | Admitting: Psychiatry

## 2020-11-16 ENCOUNTER — Ambulatory Visit (INDEPENDENT_AMBULATORY_CARE_PROVIDER_SITE_OTHER): Payer: Medicare Other | Admitting: Psychiatry

## 2020-11-16 ENCOUNTER — Other Ambulatory Visit: Payer: Self-pay

## 2020-11-16 DIAGNOSIS — G253 Myoclonus: Secondary | ICD-10-CM | POA: Diagnosis not present

## 2020-11-16 DIAGNOSIS — F422 Mixed obsessional thoughts and acts: Secondary | ICD-10-CM | POA: Diagnosis not present

## 2020-11-16 DIAGNOSIS — Z79899 Other long term (current) drug therapy: Secondary | ICD-10-CM

## 2020-11-16 DIAGNOSIS — F411 Generalized anxiety disorder: Secondary | ICD-10-CM | POA: Diagnosis not present

## 2020-11-16 DIAGNOSIS — F17203 Nicotine dependence unspecified, with withdrawal: Secondary | ICD-10-CM | POA: Diagnosis not present

## 2020-11-16 DIAGNOSIS — G4733 Obstructive sleep apnea (adult) (pediatric): Secondary | ICD-10-CM

## 2020-11-16 DIAGNOSIS — F4010 Social phobia, unspecified: Secondary | ICD-10-CM | POA: Diagnosis not present

## 2020-11-16 DIAGNOSIS — G3184 Mild cognitive impairment, so stated: Secondary | ICD-10-CM

## 2020-11-16 DIAGNOSIS — F250 Schizoaffective disorder, bipolar type: Secondary | ICD-10-CM

## 2020-11-16 MED ORDER — LAMOTRIGINE 150 MG PO TABS: ORAL_TABLET | ORAL | 1 refills | Status: DC

## 2020-11-16 MED ORDER — LITHIUM CARBONATE ER 450 MG PO TBCR: 450.0000 mg | EXTENDED_RELEASE_TABLET | Freq: Two times a day (BID) | ORAL | 1 refills | Status: DC

## 2020-11-16 MED ORDER — FLUOXETINE HCL 20 MG PO CAPS: 40.0000 mg | ORAL_CAPSULE | Freq: Every day | ORAL | 1 refills | Status: DC

## 2020-11-16 MED ORDER — MODAFINIL 200 MG PO TABS: ORAL_TABLET | ORAL | 5 refills | Status: DC

## 2020-11-16 MED ORDER — PROPRANOLOL HCL 20 MG PO TABS: ORAL_TABLET | ORAL | 1 refills | Status: DC

## 2020-11-16 MED ORDER — CLOZAPINE 100 MG PO TABS: 600.0000 mg | ORAL_TABLET | Freq: Every day | ORAL | 5 refills | Status: DC

## 2020-11-16 NOTE — Progress Notes (Signed)
Matthew Reed [id number] 24-Feb-1975 45 y.o.    Subjective:   Patient ID:  Matthew Reed is a 45 y.o. (DOB [birth year]) male.  Chief Complaint:  Chief Complaint  Patient presents with  . Follow-up  . Schizoaffective disorder  . Memory Loss     HPI  Matthew Reed presents to the office today for follow-up of smoking and schizoaffective disorder, MCI and Se meds.  seen August 2020.  He wanted to have a trial of Wellbutrin for smoking cessation that was initiated 150 mg in the morning to then increase to 300 mg, the usual dose.  Patient called October 5 with the following complaint:Pt is having pretty bad side effects he thinks its from the Lithium he is on. Pt's leg start twitching then he falls down. He said its been going on for awhile, but has got worse.  He was given the following response from our office: Patient's complaints are most consistent with myoclonic jerks.  These can occur with lithium.  They can also occur with clozapine.  They are more common when clozapine dosages are above 600 mg daily and the patient is taking 700 mg daily.  Serum lithium level was 1.0.  Serum BMP is within normal limits including creatinine 1.26 calcium 9.1.  TSH is also normal at 3.1  His lithium level is not high.  Usually lithium related jerks do not lead to falls.  It is more likely this is related to clozapine.  The treatment for clozapine related jerks is lamotrigine.  The last note from the patient states that he has already reduced the lithium by half tablet daily.  If he has done that for over a week then the lithium is not the cause and we need to start the lamotrigine.  We cannot reduce the lithium further.  It typically takes about 1 week for reduction in lithium dosage to lead to reduction in side effects.  If it has been 1 full week then it is necessary to start the low-dose lamotrigine as noted below.  If it has been less than 1 week he can wait until it has been 1 week and then notify  us.  Because it is a Friday I will go ahead and send in the prescription for lamotrigine and he can choose to start it now or defer until he has been on the lower dose of lithium for 1 full week.  Instructions on lamotrigine dosing will be on the prescription.   TC  Reduced lithium to 2 daily about 5 days and the jerks are improved.  Matthew Reed a couple of times.  Better now.    Has reduced the clozapine from 700 to 600 mg daily.  He's reduced his smoking by 1/2 or more.  This will tend to increase the blood clozapine.  If the sx continue mid next week then call and may require checking blood level of clozapine or adding lamotrigine.  Wellbutrin has helped reduce the smoking.  There are several issues that could be going on here.  Both lithium and clozapine can clear trigger myoclonic jerks.  He is already reduced the lithium.  He is also reduced to clozapine but he is also reduce smoking which will tend to increase the clozapine blood level.  Clozapine levels above 600 mg a day can trigger myoclonic jerks and potentially dose-related seizures.  Often lamotrigine is used to counteract this problem.  However there have been so many changes lately that it seems prudent to wait  until next week once the lithium levels are more stable in order to better evaluate the situation.  If the jerks continue then we will probably check a blood level of clozapine and also add lamotrigine.  If jerks continue then call next week.  He agrees to this plan.  His lithium level in October before he reduced lithium was 1.0 Since that appointment he has reduced both lithium and clozapine because of jerks.   seen December 04, 2018 and the following was discussed. He just increased the lamotrigine to 100 early November and hasn't seen changes with the jerks.  They are scary bc have caused falls 3-4 times since here but last time was bc tripped.  Legs and arms will jerk.  Less tremor lately with hands.  Legs will get week and shake  and not feel strong enough to carry him.  If he tries to lift something over 20# it will happen.  No unusual back problems.  Hx sciatica resolved. Reduced smoking from 40 to 10 daily with Wellbutrin 150 helping.    OK trial Wellbutrin XL  300 mg AM retrial for smoking cessation DT failure of Chantix and nicotine patches.  Disc SE in detail.  Discussed the risk that this could increasing's anxiety or cause paranoia or mania.  Because of the severity of risk of smoking to his overall health it is reasonable to try this. Disc DDI clozapine and smoking.  Evidence suggests 40% lower levels in smokers which may require reducing the clozapine as he quits smoking.  He has reduced from 700 to 500 mg in the last week.   OK reduce clozapine to 400 mg daily.  Disc relapse risk.  He called December 1 reporting the leg jerks were better but not gone and wanted an increase to low lamotrigine to try to help with this problem.  It was increased from 100 to 150 mg daily at that time.  seen January 20, 2019. No problems are worse with reduction in clozapine to 400 mg and sleepiness is better.  Still tends to worry over things too much.  Paranoia still controlled. No mood swings.   Cigarettes 10-12 daily.  Wellbutrin helped him cut back.  Wants to stop. Jerks are 80-90% better and mainly when doing heavy lifting. He wanted to increase the Wellbutrin XL to 450 mg daily to see if he could completely stop smoking and that dose change was made.  03/18/2019 appointment the following is noted: Started having more paranoia and increased clozapine to 500 mg and the paranoia resolved. Forgot to increase Wellbutrin and still smoking 7-10 days.  Working PT and it's good.  Just a few hours weekly.  Gets fatigued if does it too much. Jerks are 80% better and asks about increasing lamotrigine from 225 mg AM. Still preaches and tolerated critique.  No paranoia.  Mood stable.  No mania.  Cut hours PT at work bc too stressed and that  helped.  Patient reports stable mood and denies depressed or irritable moods.   Patient denies difficulty with sleep initiation or maintenance. Sleep 11-1028. Using BiPap. Denies appetite disturbance.  Patient reports that energy and motivation have been good.  Patient denies any difficulty with concentration.  Patient denies any suicidal ideation. Asks about increasing Wellbutrin to the max to try stop smoking completely.  He is aware of the risk of it causing more anxiety and paranoia.   Plan: Increase Wellbutrin XL to 450 mg daily to maximize benefit for smoking cessation. Increase  lamotrigine to 2 daily to help with jerks.  06/17/2019 appointment the following is noted: Took Wellbutrin 450 mg for awhile and was cranky, moody and paranoid and stopped and it resolved off of it. It did help with smoking.  Increased smoking caused increased paranoia and needed to increase clozapine to 600 mg daily from 500 mg daily. Some PT work stressed dealing with the public but usually can handle it if not around people so much. Sleep OK with bipap with good reading.  Machine is 46 years old. Still occ speaks at church. Jerking is a lot better but it still happens.  No falls. Propranolol helps anxiety and tremor. Plan: For the jerks increase lamotrigine to 2 tablets in the morning. Increase Wellbutrin to 3 of the 150 mg tablets each morning. He forgot to do this last visit.  10/21/19 appt noted: Couldn't tolerate Wellbutrin DT moodiness and stopped. CO OCD anxiety compulsively checks for keys and schedule bc fear of losing or forgetting things.  Hides his checking from others.  Occ may have to recheck but obsesses on it.  Wife doesn't notice his OCD.   Anxiety is not too bad otherwise.  Not paranoid.  Not depressed.   Patient reports stable mood and denies depressed or irritable moods.  Patient denies any recent difficulty with anxiety.  Patient denies difficulty with sleep initiation or maintenance. Denies  appetite disturbance.  Patient reports that energy and motivation have been good.  Patient denies any difficulty with concentration.  Patient denies any suicidal ideation. Grinding teeth he thinks at night.   No problems with other meds.   Less leg twitches now.   Gets tired about 2-3 in afternoon. Plan: Consider afternoon modafinil. Yes, 200 mg in AM and 100 mg in PM Wellbutrin made him moody and he had to stop it.  02/22/2020 appointment with following noted: OCD is getting worse.  Repeated checking rituals around eating, not sure about anything.  So frustrating.  Checks lights when driving.   No reason for it to be worse unless stress but doesn't feel more stressed out. Fatigue and wants thyroid checked.  No mood swings.  No mania.  Plan start sertraline up to 100 mg daily for OCD type sx.  04/21/2020 appt noted: Sertraline 100 worked a little for OCD but affecting sex function and concerned about it. Everything else is great. Not having to check things as much but still does.  Still worries.  Does he have keys or schedule.  Doubts about what he is doing.   Taking energy shot drinks bc feels tired. Sleep doc pleased with bipap with good response. Not jittery with energy drinks and doesn't affect sleep. Increase modafinil to 300 mg daily. Plan: Sertraline helped OCD some but sexual SE.  Concerns about DDI with clozapine from fluvoxamine and fluoxetine which both have probably less sex SE.  Least risk probably with fluoxetine so will switch to it.  Disc Ddi and risk sedation. Switch sertraline to fluoxetine 40 mg daily  Reduce sertraline to 1/2 tablet and start fluoxetine 1of the 20 mg capsule daily for 1 week. Then increase fluoxetine to 2 capsules daily and stop sertraline.   05/17/2020 phone call: Patient complaining that clozapine was making him sick excessively sleepy. MD response: He is currently taking clozapine 600 mg in the evening. He reduced the clozapine dose in 2017 and gradually  had worsening anxiety and paranoia at 500 mg daily.  However if he is willing to take that risk he can  reduce the clozapine to 500 mg at night.  06/22/2020 appointment with the following noted: Still OCD sx not any better and no different from when on sertraline.  Checks things repeatedly, schedule locks.   Also still drowsy after reducing the clozapine to 500 mg at night. Seen with wife who said there were couple of instances of paranoia at work.  He mentioned to her a week ago.  She says day to day he seems ok.   Matthew Reed concerned about his memory.  He can't remember things and it wears her out.  She has to repeat instructions to him repeatedly.  Can't depend on him to keep up with stuff.   Pulm saw him and his AHI was 0.3. She sees general cognitive decline. Plan: Reduce lamotrigine to 1-1/2 tablets in the morning to see if it helps memory increase modafinil to 2 tablets daily Check vitamin B12 and folate level they were normal.  11/16/2020 appointment with the following noted: Will be watching new grandchild in December. Doing good.  Work PT [employer]. Feel like he's not getting enough oxygen.   Sleep variable.  Paranoia under control. Sleep good and using CPAP.  He reduced the clozapine dose and 2017 and gradually had worsening anxiety and paranoia at 500 mg daily.  Past Psychiatric Medication Trials:  On clozapine 700 since July 2018 to August 2020.  Chantix N and irritable and tired.  Tried twice.  Wellbutrin 300 Haldol dystonia  olanzapine for many years, ziprasidone, risperidone,  benzodiazepine dependence remotely,  modafinil,  propranolol,  fluvoxamine,  Sertraline 100 Aricept no response, Exelon partial response, Nuvigil, Provigil Remote history of abuse of CBZ  Review of Systems:  Review of Systems  Constitutional:  Negative for fatigue.  Respiratory:  Positive for shortness of breath.   Cardiovascular:  Negative for palpitations.  Genitourinary:        ED and ejac delay   Neurological:  Negative for tremors and weakness.       Myoclonic jerks not evident in the office but reported.  Psychiatric/Behavioral:  Positive for decreased concentration. Negative for agitation, behavioral problems, confusion, dysphoric mood, hallucinations, self-injury, sleep disturbance and suicidal ideas. The patient is not nervous/anxious and is not hyperactive.  Not usually short of breath.  Medications: I have reviewed the patient's current medications.  Current Outpatient Medications  Medication Sig Dispense Refill  . cloZAPine (CLOZARIL) 100 MG tablet Take 6 tablets (600 mg total) by mouth daily. 180 tablet 5  . FLUoxetine (PROZAC) 20 MG capsule Take 2 capsules (40 mg total) by mouth daily. 180 capsule 1  . lamoTRIgine (LAMICTAL) 150 MG tablet TAKE 1.5 TABLETS BY MOUTH DAILY 135 tablet 1  . lithium carbonate (ESKALITH) 450 MG CR tablet Take 1 tablet (450 mg total) by mouth 2 (two) times daily. 180 tablet 1  . modafinil (PROVIGIL) 200 MG tablet TAKE 1 TABLET BY MOUTH IN THE MORNING AND 1 TABLET AT 2:00 PM 60 tablet 5  . propranolol (INDERAL) 20 MG tablet 2-3 tablets twice daily for tremor or anxiety 540 tablet 1   No current facility-administered medications for this visit.    Medication Side Effect: fatigue  Allergies: No Known Allergies  Past Medical History:  Diagnosis Date  . Schizoaffective disorder ([place])     Family History  Problem Relation Age of Onset  . Kidney cancer Mother   . Diabetes Mother   . Hypertension Mother   . Hyperlipidemia Mother   . Hypertension Father     Social History  Socioeconomic History  . Marital status: Married    Spouse name: Not on file  . Number of children: Not on file  . Years of education: Not on file  . Highest education level: Not on file  Occupational History  . 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  Tobacco Use  . Smoking status: Every Day    Packs/day: 1.00    Years: 25.00    Pack years: 25.00     Types: Cigarettes  . Smokeless tobacco: Never  . Tobacco comments:    smokes 1.5 packs per day 04/18/2020  Vaping Use  . Vaping Use: Never used  Substance and Sexual Activity  . Alcohol use: No  . Drug use: No  . Sexual activity: Not on file  Other Topics Concern  . Not on file  Social History Narrative  . Not on file   Social Determinants of Health   Financial Resource Strain: Not on file  Food Insecurity: Not on file  Transportation Needs: Not on file  Physical Activity: Not on file  Stress: Not on file  Social Connections: Not on file  Intimate Partner Violence: Not on file    Past Medical History, Surgical history, Social history, and Family history were reviewed and updated as appropriate.   Please see review of systems for further details on the patient's review from today.   Objective:   Physical Exam:  SpO2 96%   Physical Exam Constitutional:      General: He is not in acute distress.    Appearance: He is well-developed.  Musculoskeletal:        General: No deformity.  Neurological:     Mental Status: He is alert and oriented to person, place, and time.     Motor: No tremor.     Coordination: Coordination normal.     Gait: Gait normal.  Psychiatric:        Attention and Perception: Attention and perception normal. He is attentive.        Mood and Affect: Mood is anxious. Mood is not depressed. Affect is not labile, blunt, angry or inappropriate.        Speech: Speech normal.        Behavior: Behavior normal.        Thought Content: Thought content normal. Thought content is not paranoid. Thought content does not include homicidal or suicidal ideation. Thought content does not include homicidal or suicidal plan.        Cognition and Memory: Memory is impaired. He exhibits impaired recent memory.        Judgment: Judgment normal.     Comments: Insight is good. OCD is unchanged but not severe at this time.  Cognition may be somewhat improved    Lab  Review:     Component Value Date/Time   NA 141 03/09/2007 2256   K 4.1 03/09/2007 2256   CL 105 03/09/2007 2256   CO2 28 03/09/2007 2256   GLUCOSE 107 (H) 03/09/2007 2256   BUN 7 03/09/2007 2256   CREATININE 0.94 03/09/2007 2256   CALCIUM 9.5 03/09/2007 2256   GFRNONAA >60 03/09/2007 2256   GFRAA  03/09/2007 2256    >60        The eGFR has been calculated using the MDRD equation. This calculation has not been validated in all clinical       Component Value Date/Time   WBC 6.1 01/04/2020 0853   WBC 4.7 03/09/2007 2256   RBC 5.28 01/04/2020  0853   RBC 5.06 03/09/2007 2256   HGB 15.7 01/04/2020 0853   HCT 47.0 01/04/2020 0853   PLT 171 01/04/2020 0853   MCV 89 01/04/2020 0853   MCH 29.7 01/04/2020 0853   MCHC 33.4 01/04/2020 0853   MCHC 35.3 03/09/2007 2256   RDW 13.4 01/04/2020 0853   LYMPHSABS 1.7 01/04/2020 0853   MONOABS 0.4 03/09/2007 2256   EOSABS 0.0 01/04/2020 0853   BASOSABS 0.1 01/04/2020 0853    No results found for: POCLITH, LITHIUM   Lab Results  Component Value Date   VALPROATE 78.4 03/15/2007     .res Assessment: Plan:    Schizoaffective disorder, bipolar type ([place]) - Plan: cloZAPine (CLOZARIL) 100 MG tablet  Social anxiety disorder - Plan: propranolol (INDERAL) 20 MG tablet  Mixed obsessional thoughts and acts - Plan: FLUoxetine (PROZAC) 20 MG capsule  Generalized anxiety disorder  Obstructive sleep apnea - Plan: modafinil (PROVIGIL) 200 MG tablet  Myoclonic jerking - Plan: lamoTRIgine (LAMICTAL) 150 MG tablet  Nicotine dependence with withdrawal, unspecified nicotine product type  Mild cognitive impairment  Lithium use  Long term current use of clozapine   He failed to tolerate even the lower dose of Chantix.  Though it was helpful. Disc smoking cessation and vaping concerns.  His schizoaffective disorder symptoms including paranoia and irritability are well controlled with the current medications.  Including the clozapine.  His  CBC with differential has been stable and unremarkable.  These have been recorded in the paper chart because it is difficult to send the results to the pharmacy and our REMS through epic we discussed the side effects of clozapine in detail.    We have tried to mitigate excessive tiredness from clozapine that through the use of modafinil or Nuvigil with partial success.Consider switch to [facility]. Helped to increase modafinil 400 mg daily.  He's using CPAP regularly. Dr. Halford Chessman checked it out.  For SOB see PCP.  He has mild cognitive impairment is not that severe.  Sertraline helped OCD some but sexual SE.  Concerns about DDI with clozapine from fluvoxamine and fluoxetine which both have probably less sex SE.  Least risk probably with fluoxetine so will switch to it.  Disc Ddi and risk sedation. Continue fluoxetine 40 mg daily  He was having myoclonic jerks in legs and arms likely from clozapine but better and no falls..   Myoclonic jerks are better but not gone and he wants trial of higher dose.  OK with reduction lamotrigine to 1 and 1/2 tablets in the morning to see if cognition is better.  It appears to be helping.  ANC levels are within normal limits.  Options for labs [facility] or [facility]  TSH and lithium level DT Fatigue.  Done and is OK normal B12 and folate DT worsening cognition June 2022 He and this office are having difficulties dealing with his current lab which is an independent laboratory that does not send results in epic.  Consider memantine for cognition if not better.  Disc DDI clozapine and smoking.  Evidence suggests 40% lower levels in smokers which may require reducing the clozapine as he quits smoking.  He has reduced from 700 to 500 mg in the last few months Continue clozapine to 600 mg daily.  Disc relapse risk.  His paranoia had worsened at 400 mg daily.  It resolved again when he increase the dose back to 500 mg daily. We discussed the risk of worsening paranoia if we  reduce the dosage.  He agrees  Follow-up 4-6 mos  Lynder Parents, MD, DFAPA   No future appointments.  No orders of the defined types were placed in this encounter. Reduce lamotrigine to 1-1/2 tablets in the morning to see if it helps memory  Increase modafinil to 2 tablets daily  Check vitamin B12 and folate level    -------------------------------

## 2020-11-22 ENCOUNTER — Telehealth: Payer: Self-pay | Admitting: Psychiatry

## 2020-11-22 NOTE — Telephone Encounter (Signed)
Pt called and said that he needs a letter or note to be faxed to his job. The note needs to say that due to his tremors he is unable to lift anything. The fax number is  [phone]

## 2020-11-22 NOTE — Telephone Encounter (Signed)
Please review

## 2020-11-23 NOTE — Telephone Encounter (Signed)
Pt stated his tremors cause him to be unsteady and he fell at work while lifting.

## 2020-11-23 NOTE — Telephone Encounter (Signed)
I do not understand this request.  Having a tremor does not keep one from being able to lift objects.  Did some event trigger him to call with this request?  Why does not he feel like he can lift objects at work?

## 2020-11-30 ENCOUNTER — Encounter: Payer: Self-pay | Admitting: Psychiatry

## 2020-12-13 ENCOUNTER — Encounter: Payer: Self-pay | Admitting: Psychiatry

## 2020-12-13 DIAGNOSIS — Z79899 Other long term (current) drug therapy: Secondary | ICD-10-CM | POA: Diagnosis not present

## 2020-12-13 DIAGNOSIS — F250 Schizoaffective disorder, bipolar type: Secondary | ICD-10-CM | POA: Diagnosis not present

## 2020-12-17 ENCOUNTER — Other Ambulatory Visit: Payer: Self-pay | Admitting: Psychiatry

## 2021-01-05 DIAGNOSIS — F250 Schizoaffective disorder, bipolar type: Secondary | ICD-10-CM | POA: Diagnosis not present

## 2021-01-05 DIAGNOSIS — Z79899 Other long term (current) drug therapy: Secondary | ICD-10-CM | POA: Diagnosis not present

## 2021-02-06 ENCOUNTER — Encounter: Payer: Self-pay | Admitting: Psychiatry

## 2021-02-06 DIAGNOSIS — F250 Schizoaffective disorder, bipolar type: Secondary | ICD-10-CM | POA: Diagnosis not present

## 2021-02-06 DIAGNOSIS — Z79899 Other long term (current) drug therapy: Secondary | ICD-10-CM | POA: Diagnosis not present

## 2021-02-24 ENCOUNTER — Telehealth: Payer: Self-pay

## 2021-02-24 NOTE — Telephone Encounter (Signed)
Prior authorization submitted and approved for MODAFINIL 200 MG #60 effective through 08/14/2021 with [employer] Part D

## 2021-03-07 DIAGNOSIS — Z79899 Other long term (current) drug therapy: Secondary | ICD-10-CM | POA: Diagnosis not present

## 2021-03-07 DIAGNOSIS — F250 Schizoaffective disorder, bipolar type: Secondary | ICD-10-CM | POA: Diagnosis not present

## 2021-03-08 ENCOUNTER — Encounter: Payer: Self-pay | Admitting: Psychiatry

## 2021-03-17 ENCOUNTER — Encounter: Payer: Self-pay | Admitting: Psychiatry

## 2021-03-28 ENCOUNTER — Encounter: Payer: Self-pay | Admitting: Psychiatry

## 2021-03-28 DIAGNOSIS — Z79899 Other long term (current) drug therapy: Secondary | ICD-10-CM | POA: Diagnosis not present

## 2021-03-28 DIAGNOSIS — F250 Schizoaffective disorder, bipolar type: Secondary | ICD-10-CM | POA: Diagnosis not present

## 2021-05-02 ENCOUNTER — Encounter: Payer: Self-pay | Admitting: Psychiatry

## 2021-05-02 DIAGNOSIS — Z79899 Other long term (current) drug therapy: Secondary | ICD-10-CM | POA: Diagnosis not present

## 2021-05-12 ENCOUNTER — Other Ambulatory Visit: Payer: Self-pay | Admitting: Psychiatry

## 2021-05-12 DIAGNOSIS — G253 Myoclonus: Secondary | ICD-10-CM

## 2021-05-17 ENCOUNTER — Encounter: Payer: Self-pay | Admitting: Psychiatry

## 2021-05-17 ENCOUNTER — Ambulatory Visit (INDEPENDENT_AMBULATORY_CARE_PROVIDER_SITE_OTHER): Payer: Medicare Other | Admitting: Psychiatry

## 2021-05-17 DIAGNOSIS — F17203 Nicotine dependence unspecified, with withdrawal: Secondary | ICD-10-CM

## 2021-05-17 DIAGNOSIS — F250 Schizoaffective disorder, bipolar type: Secondary | ICD-10-CM

## 2021-05-17 DIAGNOSIS — G3184 Mild cognitive impairment, so stated: Secondary | ICD-10-CM | POA: Diagnosis not present

## 2021-05-17 DIAGNOSIS — Z79899 Other long term (current) drug therapy: Secondary | ICD-10-CM | POA: Diagnosis not present

## 2021-05-17 DIAGNOSIS — F422 Mixed obsessional thoughts and acts: Secondary | ICD-10-CM | POA: Diagnosis not present

## 2021-05-17 DIAGNOSIS — G4733 Obstructive sleep apnea (adult) (pediatric): Secondary | ICD-10-CM | POA: Diagnosis not present

## 2021-05-17 DIAGNOSIS — G253 Myoclonus: Secondary | ICD-10-CM | POA: Diagnosis not present

## 2021-05-17 DIAGNOSIS — F411 Generalized anxiety disorder: Secondary | ICD-10-CM

## 2021-05-17 DIAGNOSIS — F4010 Social phobia, unspecified: Secondary | ICD-10-CM

## 2021-05-17 NOTE — Progress Notes (Signed)
ACESON LABELL ?[id number] ?[birth year] ?46 y.o. ? ? ? ?Subjective:  ? ?Patient ID:  Matthew Reed is a 45 y.o. (DOB 06/08/1975) male. ? ?Chief Complaint:  ?Chief Complaint  ?Patient presents with  ? Follow-up  ? Fatigue  ? ?  ?HPI  ?Matthew Reed presents to the office today for follow-up of smoking and schizoaffective disorder, MCI and Se meds. ? ?seen August 2020.  He wanted to have a trial of Wellbutrin for smoking cessation that was initiated 150 mg in the morning to then increase to 300 mg, the usual dose. ? ?Patient called October 5 with the following complaint:Pt is having pretty bad side effects he thinks its from the Lithium he is on. Pt's leg start twitching then he falls down. He said its been going on for awhile, but has got worse. ? ?He was given the following response from our office: ?Patient's complaints are most consistent with myoclonic jerks.  These can occur with lithium.  They can also occur with clozapine.  They are more common when clozapine dosages are above 600 mg daily and the patient is taking 700 mg daily. ? Serum lithium level was 1.0.  Serum BMP is within normal limits including creatinine 1.26 calcium 9.1.  TSH is also normal at 3.1 ? His lithium level is not high.  Usually lithium related jerks do not lead to falls.  It is more likely this is related to clozapine.  The treatment for clozapine related jerks is lamotrigine. ? The last note from the patient states that he has already reduced the lithium by half tablet daily.  If he has done that for over a week then the lithium is not the cause and we need to start the lamotrigine.  We cannot reduce the lithium further.  It typically takes about 1 week for reduction in lithium dosage to lead to reduction in side effects.  If it has been 1 full week then it is necessary to start the low-dose lamotrigine as noted below.  If it has been less than 1 week he can wait until it has been 1 week and then notify us. ? Because it is a Friday I will  go ahead and send in the prescription for lamotrigine and he can choose to start it now or defer until he has been on the lower dose of lithium for 1 full week.  Instructions on lamotrigine dosing will be on the prescription. ?  ?TC ? Reduced lithium to 2 daily about 5 days and the jerks are improved.  Larey Seat a couple of times.  Better now.   ? Has reduced the clozapine from 700 to 600 mg daily. ? He's reduced his smoking by 1/2 or more.  This will tend to increase the blood clozapine. ? If the sx continue mid next week then call and may require checking blood level of clozapine or adding lamotrigine.  Wellbutrin has helped reduce the smoking. ? There are several issues that could be going on here.  Both lithium and clozapine can clear trigger myoclonic jerks.  He is already reduced the lithium.  He is also reduced to clozapine but he is also reduce smoking which will tend to increase the clozapine blood level.  Clozapine levels above 600 mg a day can trigger myoclonic jerks and potentially dose-related seizures.  Often lamotrigine is used to counteract this problem.  However there have been so many changes lately that it seems prudent to wait until next week once the  lithium levels are more stable in order to better evaluate the situation.  If the jerks continue then we will probably check a blood level of clozapine and also add lamotrigine. ? If jerks continue then call next week.  He agrees to this plan. ? ?His lithium level in October before he reduced lithium was 1.0 ?Since that appointment he has reduced both lithium and clozapine because of jerks.  ? ?seen December 04, 2018 and the following was discussed. ?He just increased the lamotrigine to 100 early November and hasn't seen changes with the jerks.  They are scary bc have caused falls 3-4 times since here but last time was bc tripped.  Legs and arms will jerk.  Less tremor lately with hands.  Legs will get week and shake and not feel strong enough to carry  him.  If he tries to lift something over 20# it will happen.  No unusual back problems.  Hx sciatica resolved. ?Reduced smoking from 40 to 10 daily with Wellbutrin 150 helping.   ? OK trial Wellbutrin XL  300 mg AM retrial for smoking cessation DT failure of Chantix and nicotine patches.  Disc SE in detail.  Discussed the risk that this could increasing's anxiety or cause paranoia or mania.  Because of the severity of risk of smoking to his overall health it is reasonable to try this. ?Disc DDI clozapine and smoking.  Evidence suggests 40% lower levels in smokers which may require reducing the clozapine as he quits smoking.  He has reduced from 700 to 500 mg in the last week.   ?OK reduce clozapine to 400 mg daily.  Disc relapse risk. ? ?He called December 1 reporting the leg jerks were better but not gone and wanted an increase to low lamotrigine to try to help with this problem.  It was increased from 100 to 150 mg daily at that time. ? ?seen January 20, 2019. No problems are worse with reduction in clozapine to 400 mg and sleepiness is better.  Still tends to worry over things too much.  Paranoia still controlled. No mood swings.   ?Cigarettes 10-12 daily.  Wellbutrin helped him cut back.  Wants to stop. ?Jerks are 80-90% better and mainly when doing heavy lifting. ?He wanted to increase the Wellbutrin XL to 450 mg daily to see if he could completely stop smoking and that dose change was made. ? ?03/18/2019 appointment the following is noted: ?Started having more paranoia and increased clozapine to 500 mg and the paranoia resolved. ?Forgot to increase Wellbutrin and still smoking 7-10 days.  Working PT and it's good.  Just a few hours weekly.  Gets fatigued if does it too much. ?Jerks are 80% better and asks about increasing lamotrigine from 225 mg AM. ?Still preaches and tolerated critique.  No paranoia.  Mood stable.  No mania.  Cut hours PT at work bc too stressed and that helped.  Patient reports stable mood and  denies depressed or irritable moods.   Patient denies difficulty with sleep initiation or maintenance. Sleep 11-1028. Using BiPap. Denies appetite disturbance.  Patient reports that energy and motivation have been good.  Patient denies any difficulty with concentration.  Patient denies any suicidal ideation. ?Asks about increasing Wellbutrin to the max to try stop smoking completely. ? He is aware of the risk of it causing more anxiety and paranoia.   ?Plan: Increase Wellbutrin XL to 450 mg daily to maximize benefit for smoking cessation. ?Increase lamotrigine to 2 daily to  help with jerks. ? ?06/17/2019 appointment the following is noted: ?Took Wellbutrin 450 mg for awhile and was cranky, moody and paranoid and stopped and it resolved off of it. ?It did help with smoking.  Increased smoking caused increased paranoia and needed to increase clozapine to 600 mg daily from 500 mg daily. ?Some PT work stressed dealing with the public but usually can handle it if not around people so much. ?Sleep OK with bipap with good reading.  Machine is 47 years old. ?Still occ speaks at church. ?Jerking is a lot better but it still happens.  No falls. ?Propranolol helps anxiety and tremor. ?Plan: For the jerks increase lamotrigine to 2 tablets in the morning. ?Increase Wellbutrin to 3 of the 150 mg tablets each morning. He forgot to do this last visit. ? ?10/21/19 appt noted: ?Couldn't tolerate Wellbutrin DT moodiness and stopped. ?CO OCD anxiety compulsively checks for keys and schedule bc fear of losing or forgetting things.  Hides his checking from others.  Occ may have to recheck but obsesses on it.  Wife doesn't notice his OCD.   ?Anxiety is not too bad otherwise.  Not paranoid.  Not depressed.   ?Patient reports stable mood and denies depressed or irritable moods.  Patient denies any recent difficulty with anxiety.  Patient denies difficulty with sleep initiation or maintenance. Denies appetite disturbance.  Patient reports that  energy and motivation have been good.  Patient denies any difficulty with concentration.  Patient denies any suicidal ideation. ?Grinding teeth he thinks at night.   ?No problems with other meds.   ?Conley RollsLe

## 2021-05-31 ENCOUNTER — Encounter: Payer: Self-pay | Admitting: Psychiatry

## 2021-05-31 DIAGNOSIS — Z79899 Other long term (current) drug therapy: Secondary | ICD-10-CM | POA: Diagnosis not present

## 2021-06-07 ENCOUNTER — Telehealth: Payer: Self-pay | Admitting: Psychiatry

## 2021-06-07 DIAGNOSIS — F250 Schizoaffective disorder, bipolar type: Secondary | ICD-10-CM

## 2021-06-08 ENCOUNTER — Other Ambulatory Visit: Payer: Self-pay | Admitting: Psychiatry

## 2021-06-08 ENCOUNTER — Other Ambulatory Visit: Payer: Self-pay

## 2021-06-08 DIAGNOSIS — G4733 Obstructive sleep apnea (adult) (pediatric): Secondary | ICD-10-CM

## 2021-06-08 MED ORDER — LITHIUM CARBONATE ER 450 MG PO TBCR: 450.0000 mg | EXTENDED_RELEASE_TABLET | Freq: Two times a day (BID) | ORAL | 1 refills | Status: DC

## 2021-06-08 MED ORDER — VARENICLINE TARTRATE 1 MG PO TABS: ORAL_TABLET | ORAL | 3 refills | Status: DC

## 2021-06-08 MED ORDER — MODAFINIL 200 MG PO TABS: ORAL_TABLET | ORAL | 5 refills | Status: DC

## 2021-06-08 NOTE — Telephone Encounter (Signed)
A new script was sent yesterday. Patient must have gotten all that was left from an older Rx.  Will pend scripts to Dr. Jennelle Human. Patient is also asking to try Chantix again for smoking cessation. He said he has tried several times in the past but wants to try again.

## 2021-06-08 NOTE — Telephone Encounter (Signed)
Rico called this morning at 9:35am because the pharmacy only gave him #60 of the Clozapine when he picked it up.  They told him that was all that was left on the prescription.  This doesn't make any since.  Please call the pharmacy to clarify the problem.  He also needs refills for his Modafinil and lithium.  Appt 8/3.  [facility] on [address], [place], [place]

## 2021-06-12 ENCOUNTER — Other Ambulatory Visit: Payer: Self-pay | Admitting: Psychiatry

## 2021-07-11 ENCOUNTER — Encounter: Payer: Self-pay | Admitting: Psychiatry

## 2021-07-11 DIAGNOSIS — Z79899 Other long term (current) drug therapy: Secondary | ICD-10-CM | POA: Diagnosis not present

## 2021-07-19 ENCOUNTER — Telehealth: Payer: Self-pay | Admitting: Psychiatry

## 2021-07-19 NOTE — Telephone Encounter (Signed)
Please advise 

## 2021-07-19 NOTE — Telephone Encounter (Signed)
Pt called and at 4 pm asking if he could decrease the clozapine. He has stopped smoking and he said that when you stop smoking it works 40 % stronger. Please let him know at [phone]

## 2021-07-19 NOTE — Telephone Encounter (Signed)
We have already reduced the clozapine from 600 to 500 mg nightly.  Assuming he is been okay with that dose reduction yes he can reduce it to 400 mg at night. Please verify he's taking 500 mg nightly now.

## 2021-07-20 NOTE — Telephone Encounter (Signed)
Pt informed.He is currently taking 500mg 

## 2021-07-28 ENCOUNTER — Other Ambulatory Visit: Payer: Self-pay | Admitting: Psychiatry

## 2021-07-28 DIAGNOSIS — F422 Mixed obsessional thoughts and acts: Secondary | ICD-10-CM

## 2021-08-15 ENCOUNTER — Telehealth: Payer: Self-pay | Admitting: Pulmonary Disease

## 2021-08-15 NOTE — Telephone Encounter (Signed)
He will need an updated OV to get CPAP supplies as it has been for more than a year.

## 2021-08-16 NOTE — Telephone Encounter (Signed)
Patient has appointment with TP on   08/25/2021 Nothing further needed

## 2021-08-17 ENCOUNTER — Encounter: Payer: Self-pay | Admitting: Psychiatry

## 2021-08-17 ENCOUNTER — Ambulatory Visit (INDEPENDENT_AMBULATORY_CARE_PROVIDER_SITE_OTHER): Payer: Medicare Other | Admitting: Psychiatry

## 2021-08-17 DIAGNOSIS — G3184 Mild cognitive impairment, so stated: Secondary | ICD-10-CM | POA: Diagnosis not present

## 2021-08-17 DIAGNOSIS — F422 Mixed obsessional thoughts and acts: Secondary | ICD-10-CM

## 2021-08-17 DIAGNOSIS — F411 Generalized anxiety disorder: Secondary | ICD-10-CM

## 2021-08-17 DIAGNOSIS — F250 Schizoaffective disorder, bipolar type: Secondary | ICD-10-CM | POA: Diagnosis not present

## 2021-08-17 DIAGNOSIS — G4733 Obstructive sleep apnea (adult) (pediatric): Secondary | ICD-10-CM

## 2021-08-17 DIAGNOSIS — F4010 Social phobia, unspecified: Secondary | ICD-10-CM | POA: Diagnosis not present

## 2021-08-17 DIAGNOSIS — Z79899 Other long term (current) drug therapy: Secondary | ICD-10-CM

## 2021-08-17 NOTE — Progress Notes (Signed)
Matthew Reed [id number] Aug 28, 1975 46 y.o.    Subjective:   Patient ID:  Matthew Reed is a 46 y.o. (DOB May 12, 1975) male.  Chief Complaint:  Chief Complaint  Patient presents with   Follow-up    Schizoaffective disorder, bipolar type ([place])   Anxiety   Medication Reaction     HPI  Matthew Reed presents to the office today for follow-up of smoking and schizoaffective disorder, MCI and Se meds.  seen August 2020.  He wanted to have a trial of Wellbutrin for smoking cessation that was initiated 150 mg in the morning to then increase to 300 mg, the usual dose.  Patient called October 5 with the following complaint:Pt is having pretty bad side effects he thinks its from the Lithium he is on. Pt's leg start twitching then he falls down. He said its been going on for awhile, but has got worse.  He was given the following response from our office: Patient's complaints are most consistent with myoclonic jerks.  These can occur with lithium.  They can also occur with clozapine.  They are more common when clozapine dosages are above 600 mg daily and the patient is taking 700 mg daily.  Serum lithium level was 1.0.  Serum BMP is within normal limits including creatinine 1.26 calcium 9.1.  TSH is also normal at 3.1  His lithium level is not high.  Usually lithium related jerks do not lead to falls.  It is more likely this is related to clozapine.  The treatment for clozapine related jerks is lamotrigine.  The last note from the patient states that he has already reduced the lithium by half tablet daily.  If he has done that for over a week then the lithium is not the cause and we need to start the lamotrigine.  We cannot reduce the lithium further.  It typically takes about 1 week for reduction in lithium dosage to lead to reduction in side effects.  If it has been 1 full week then it is necessary to start the low-dose lamotrigine as noted below.  If it has been less than 1 week he can wait until  it has been 1 week and then notify us.  Because it is a Friday I will go ahead and send in the prescription for lamotrigine and he can choose to start it now or defer until he has been on the lower dose of lithium for 1 full week.  Instructions on lamotrigine dosing will be on the prescription.   TC  Reduced lithium to 2 daily about 5 days and the jerks are improved.  [place] a couple of times.  Better now.    Has reduced the clozapine from 700 to 600 mg daily.  He's reduced his smoking by 1/2 or more.  This will tend to increase the blood clozapine.  If the sx continue mid next week then call and may require checking blood level of clozapine or adding lamotrigine.  Wellbutrin has helped reduce the smoking.  There are several issues that could be going on here.  Both lithium and clozapine can clear trigger myoclonic jerks.  He is already reduced the lithium.  He is also reduced to clozapine but he is also reduce smoking which will tend to increase the clozapine blood level.  Clozapine levels above 600 mg a day can trigger myoclonic jerks and potentially dose-related seizures.  Often lamotrigine is used to counteract this problem.  However there have been so many changes  lately that it seems prudent to wait until next week once the lithium levels are more stable in order to better evaluate the situation.  If the jerks continue then we will probably check a blood level of clozapine and also add lamotrigine.  If jerks continue then call next week.  He agrees to this plan.  His lithium level in October before he reduced lithium was 1.0 Since that appointment he has reduced both lithium and clozapine because of jerks.   seen December 04, 2018 and the following was discussed. He just increased the lamotrigine to 100 early November and hasn't seen changes with the jerks.  They are scary bc have caused falls 3-4 times since here but last time was bc tripped.  Legs and arms will jerk.  Less tremor lately with  hands.  Legs will get week and shake and not feel strong enough to carry him.  If he tries to lift something over 20# it will happen.  No unusual back problems.  Hx sciatica resolved. Reduced smoking from 40 to 10 daily with Wellbutrin 150 helping.    OK trial Wellbutrin XL  300 mg AM retrial for smoking cessation DT failure of Chantix and nicotine patches.  Disc SE in detail.  Discussed the risk that this could increasing's anxiety or cause paranoia or mania.  Because of the severity of risk of smoking to his overall health it is reasonable to try this. Disc DDI clozapine and smoking.  Evidence suggests 40% lower levels in smokers which may require reducing the clozapine as he quits smoking.  He has reduced from 700 to 500 mg in the last week.   OK reduce clozapine to 400 mg daily.  Disc relapse risk.  He called December 1 reporting the leg jerks were better but not gone and wanted an increase to low lamotrigine to try to help with this problem.  It was increased from 100 to 150 mg daily at that time.  seen January 20, 2019. No problems are worse with reduction in clozapine to 400 mg and sleepiness is better.  Still tends to worry over things too much.  Paranoia still controlled. No mood swings.   Cigarettes 10-12 daily.  Wellbutrin helped him cut back.  Wants to stop. Jerks are 80-90% better and mainly when doing heavy lifting. He wanted to increase the Wellbutrin XL to 450 mg daily to see if he could completely stop smoking and that dose change was made.  03/18/2019 appointment the following is noted: Started having more paranoia and increased clozapine to 500 mg and the paranoia resolved. Forgot to increase Wellbutrin and still smoking 7-10 days.  Working PT and it's good.  Just a few hours weekly.  Gets fatigued if does it too much. Jerks are 80% better and asks about increasing lamotrigine from 225 mg AM. Still preaches and tolerated critique.  No paranoia.  Mood stable.  No mania.  Cut hours PT  at work bc too stressed and that helped.  Patient reports stable mood and denies depressed or irritable moods.   Patient denies difficulty with sleep initiation or maintenance. Sleep 11-1028. Using BiPap. Denies appetite disturbance.  Patient reports that energy and motivation have been good.  Patient denies any difficulty with concentration.  Patient denies any suicidal ideation. Asks about increasing Wellbutrin to the max to try stop smoking completely.  He is aware of the risk of it causing more anxiety and paranoia.   Plan: Increase Wellbutrin XL to 450 mg daily  to maximize benefit for smoking cessation. Increase lamotrigine to 2 daily to help with jerks.  06/17/2019 appointment the following is noted: Took Wellbutrin 450 mg for awhile and was cranky, moody and paranoid and stopped and it resolved off of it. It did help with smoking.  Increased smoking caused increased paranoia and needed to increase clozapine to 600 mg daily from 500 mg daily. Some PT work stressed dealing with the public but usually can handle it if not around people so much. Sleep OK with bipap with good reading.  Machine is 47 years old. Still occ speaks at church. Jerking is a lot better but it still happens.  No falls. Propranolol helps anxiety and tremor. Plan: For the jerks increase lamotrigine to 2 tablets in the morning. Increase Wellbutrin to 3 of the 150 mg tablets each morning. He forgot to do this last visit.  10/21/19 appt noted: Couldn't tolerate Wellbutrin DT moodiness and stopped. CO OCD anxiety compulsively checks for keys and schedule bc fear of losing or forgetting things.  Hides his checking from others.  Occ may have to recheck but obsesses on it.  Wife doesn't notice his OCD.   Anxiety is not too bad otherwise.  Not paranoid.  Not depressed.   Patient reports stable mood and denies depressed or irritable moods.  Patient denies any recent difficulty with anxiety.  Patient denies difficulty with sleep  initiation or maintenance. Denies appetite disturbance.  Patient reports that energy and motivation have been good.  Patient denies any difficulty with concentration.  Patient denies any suicidal ideation. Grinding teeth he thinks at night.   No problems with other meds.   Less leg twitches now.   Gets tired about 2-3 in afternoon. Plan: Consider afternoon modafinil. Yes, 200 mg in AM and 100 mg in PM Wellbutrin made him moody and he had to stop it.  02/22/2020 appointment with following noted: OCD is getting worse.  Repeated checking rituals around eating, not sure about anything.  So frustrating.  Checks lights when driving.   No reason for it to be worse unless stress but doesn't feel more stressed out. Fatigue and wants thyroid checked.  No mood swings.  No mania.  Plan start sertraline up to 100 mg daily for OCD type sx.  04/21/2020 appt noted: Sertraline 100 worked a little for OCD but affecting sex function and concerned about it. Everything else is great. Not having to check things as much but still does.  Still worries.  Does he have keys or schedule.  Doubts about what he is doing.   Taking energy shot drinks bc feels tired. Sleep doc pleased with bipap with good response. Not jittery with energy drinks and doesn't affect sleep. Increase modafinil to 300 mg daily. Plan: Sertraline helped OCD some but sexual SE.  Concerns about DDI with clozapine from fluvoxamine and fluoxetine which both have probably less sex SE.  Least risk probably with fluoxetine so will switch to it.  Disc Ddi and risk sedation. Switch sertraline to fluoxetine 40 mg daily  Reduce sertraline to 1/2 tablet and start fluoxetine 1of the 20 mg capsule daily for 1 week. Then increase fluoxetine to 2 capsules daily and stop sertraline.   05/17/2020 phone call: Patient complaining that clozapine was making him sick excessively sleepy. MD response: He is currently taking clozapine 600 mg in the evening. He reduced the  clozapine dose in 2017 and gradually had worsening anxiety and paranoia at 500 mg daily.  However if he is  willing to take that risk he can reduce the clozapine to 500 mg at night.  06/22/2020 appointment with the following noted: Still OCD sx not any better and no different from when on sertraline.  Checks things repeatedly, schedule locks.   Also still drowsy after reducing the clozapine to 500 mg at night. Seen with wife who said there were couple of instances of paranoia at work.  He mentioned to her a week ago.  She says day to day he seems ok.   Levada Dy concerned about his memory.  He can't remember things and it wears her out.  She has to repeat instructions to him repeatedly.  Can't depend on him to keep up with stuff.   Pulm saw him and his AHI was 0.3. She sees general cognitive decline. Plan: Reduce lamotrigine to 1-1/2 tablets in the morning to see if it helps memory increase modafinil to 2 tablets daily Check vitamin B12 and folate level they were normal.  11/16/2020 appointment with the following noted: Will be watching new grandchild in December. Doing good.  Work PT [employer]. Feel like he's not getting enough oxygen.   Sleep variable.  Paranoia under control. Sleep good and using CPAP.  05/17/21 appt noted: CC tired and forgetful. On clozapine to 600 mg daily.  Asks to reduce. Otherwise good mood and anxiety.   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   Plan: OK trial with reduction clozapine to 500 mg daily DT fatigue and cognive complaints.     08/17/21 appt noted: Pt called early July asking to reduce clozapine again to 400 mg .  Disc risk relapse but he wants to pursue anyway so it was agreed. Not as groggy and tired.  Mind can go blank.  Sometimes a  little more overwhelmed but brief.  No paranoia or irritability. M in Law  with [place] and wife there everyday.   PT work very low stress.  No problems dealing with people. Quit smoking and thinks that enabled him to reduce clozapine. Chantix helped  him and off for 4 weeks. Other meds: no lamotrigine, fluoxetine40, modafinil 200 BID, lihtium 450 BID, propranolol 20 BID and 40 PM. Sleep is pretty good and less groggy on awakening.  At least 8-9 hours.  No sig napping. No falls in a long time until yesterday.  No sig jerks.  He reduced the clozapine dose and 2017 and gradually had worsening anxiety and paranoia at 500 mg daily.  Past Psychiatric Medication Trials:  On clozapine 700 since July 2018 to August 2020.  Chantix N and irritable and tired.  Tried twice.  Wellbutrin 300 Haldol dystonia  olanzapine for many years, ziprasidone, risperidone,  benzodiazepine dependence remotely,  modafinil,  propranolol,  fluvoxamine,  Sertraline 100 Aricept no response, Exelon partial response, Nuvigil, Provigil Remote history of abuse of CBZ  Review of Systems:  Review of Systems  Constitutional:  Negative for fatigue.  Respiratory:  Positive for shortness of breath.   Cardiovascular:  Negative for palpitations.  Genitourinary:        ED and ejac delay  Neurological:  Negative for tremors and weakness.       Myoclonic jerks not evident in the office but reported.  Psychiatric/Behavioral:  Positive for decreased concentration. Negative for agitation, behavioral problems, confusion, dysphoric mood, hallucinations, self-injury, sleep disturbance and suicidal ideas. The patient is not nervous/anxious and is not hyperactive.   Not usually short of breath.  Medications: I have reviewed the patient's current medications.  Current Outpatient Medications  Medication  Sig Dispense Refill   cloZAPine (CLOZARIL) 100 MG tablet TAKE 6 TABLETS BY MOUTH EVERY DAY (Patient taking differently: 4 tabs daily) 180 tablet 5   FLUoxetine (PROZAC) 20 MG capsule TAKE 2 CAPSULES(40 MG) BY MOUTH DAILY 180 capsule 0   lithium carbonate (ESKALITH) 450 MG CR tablet Take 1 tablet (450 mg total) by mouth 2 (two) times daily. 180 tablet 1   modafinil (PROVIGIL) 200 MG  tablet TAKE 1 TABLET BY MOUTH IN THE MORNING AND 1 TABLET AT 2:00 PM 60 tablet 5   propranolol (INDERAL) 20 MG tablet 2-3 tablets twice daily for tremor or anxiety 540 tablet 1   varenicline (CHANTIX) 1 MG tablet 1/2 tablet twice daily for 1 week then 1 tablet twice daily (Patient taking differently: 1 tablet twice daily) 60 tablet 3   lamoTRIgine (LAMICTAL) 150 MG tablet TAKE 1 AND 1/2 TABLETS BY MOUTH DAILY (Patient not taking: Reported on 08/17/2021) 45 tablet 0   No current facility-administered medications for this visit.    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  Tobacco Use   Smoking status: Every Day    Packs/day: 1.00    Years: 25.00    Total pack years: 25.00    Types: Cigarettes   Smokeless tobacco: Never   Tobacco comments:    smokes 1.5 packs per day 04/18/2020  Vaping Use   Vaping Use: Never used  Substance and Sexual Activity   Alcohol use: No   Drug use: No   Sexual activity: Not on file  Other Topics Concern   Not on file  Social History Narrative   Not on file   Social Determinants of Health   Financial Resource Strain: Not on file  Food Insecurity: Not on file  Transportation Needs: Not on file  Physical Activity: Not on file  Stress: Not on file  Social Connections: Not on file  Intimate Partner Violence: Not on file    Past Medical History, Surgical history, Social history, and Family history were  reviewed and updated as appropriate.   Please see review of systems for further details on the patient's review from today.   Objective:   Physical Exam:  There were no vitals taken for this visit.  Physical Exam Constitutional:      General: He is not in acute distress.    Appearance: He is well-developed.  Musculoskeletal:        General: No deformity.  Neurological:     Mental Status: He is alert and oriented to person, place, and time.     Motor: No tremor.     Coordination: Coordination normal.     Gait: Gait normal.  Psychiatric:        Attention and Perception: Attention and perception normal. He is attentive.        Mood and Affect: Mood is anxious.  Mood is not depressed. Affect is not labile, blunt, angry or inappropriate.        Speech: Speech normal.        Behavior: Behavior normal.        Thought Content: Thought content normal. Thought content is not paranoid or delusional. Thought content does not include homicidal or suicidal ideation. Thought content does not include suicidal plan.        Cognition and Memory: Memory is impaired. He exhibits impaired recent memory.        Judgment: Judgment normal.     Comments: Insight is good. OCD is unchanged but not severe at this time.       Lab Review:     Component Value Date/Time   NA 141 03/09/2007 2256   K 4.1 03/09/2007 2256   CL 105 03/09/2007 2256   CO2 28 03/09/2007 2256   GLUCOSE 107 (H) 03/09/2007 2256   BUN 7 03/09/2007 2256   CREATININE 0.94 03/09/2007 2256   CALCIUM 9.5 03/09/2007 2256   GFRNONAA >60 03/09/2007 2256   GFRAA  03/09/2007 2256    >60        The eGFR has been calculated using the MDRD equation. This calculation has not been validated in all clinical       Component Value Date/Time   WBC 6.1 01/04/2020 0853   WBC 4.7 03/09/2007 2256   RBC 5.28 01/04/2020 0853   RBC 5.06 03/09/2007 2256   HGB 15.7 01/04/2020 0853   HCT 47.0 01/04/2020 0853   PLT 171 01/04/2020 0853   MCV 89  01/04/2020 0853   MCH 29.7 01/04/2020 0853   MCHC 33.4 01/04/2020 0853   MCHC 35.3 03/09/2007 2256   RDW 13.4 01/04/2020 0853   LYMPHSABS 1.7 01/04/2020 0853   MONOABS 0.4 03/09/2007 2256   EOSABS 0.0 01/04/2020 0853   BASOSABS 0.1 01/04/2020 0853    No results found for: "POCLITH", "LITHIUM"   Lab Results  Component Value Date   VALPROATE 78.4 03/15/2007     .res Assessment: Plan:    Schizoaffective disorder, bipolar type ([place]) - Plan: Lithium level, Basic metabolic panel, TSH  Social anxiety disorder  Mixed obsessional thoughts and acts  Generalized anxiety disorder  Obstructive sleep apnea  Mild cognitive impairment  Long term current use of clozapine  Lithium use - Plan: Lithium level, Basic metabolic panel, TSH   He failed to tolerate even the lower dose of Chantix.  Though it was helpful. Disc smoking cessation and vaping concerns.  His schizoaffective disorder symptoms including paranoia and irritability are well controlled with the current medications.  Including the clozapine.  His CBC with differential has been stable and unremarkable.  These have been recorded in the paper chart because it is difficult to send the results to the pharmacy and our REMS through epic we discussed the side effects of clozapine in detail.    We have tried to mitigate excessive tiredness from clozapine that through the use of modafinil or Nuvigil with partial success.Consider switch to [facility]. Helped to increase modafinil 400 mg daily.  He's using CPAP regularly. Dr. Halford Chessman checked it out.  For SOB see PCP.  He has mild cognitive impairment is not that severe.  Sertraline helped OCD some but sexual SE.  Concerns about DDI with clozapine from fluvoxamine and fluoxetine which both have probably less sex SE.  Least risk probably with fluoxetine so will switch to it.  Disc Ddi and risk sedation. Continue fluoxetine 40 mg daily  He was having myoclonic jerks in legs and arms likely  from clozapine but better and no falls..   Myoclonic jerks are better nearly gone and he wants trial of higher dose.  OK with DC lamotrigine  He's not sure what difference it made in cogniton off the lamotrigine.  ANC levels are within normal limits.  Options for labs [facility] or [facility]  TSH and lithium level DT Fatigue.  Done and is OK normal B12 and folate DT worsening cognition June 2022 He and this office are having difficulties dealing with his current lab which is an independent laboratory that does not send results in epic. Continue lithium 450 BID Need lithium level.  Consider memantine for cognition if not better.  Disc DDI clozapine and smoking.  Evidence suggests 40% lower levels in smokers which may require reducing the clozapine as he quits smoking.  He has reduced from 700 to 500 mg in the past.  OK trial with reduction clozapine to 400 mg daily DT fatigue and cognive complaints.    Disc relapse risk.  His paranoia had worsened at 400 mg daily previously but DC smoking is probably helping [facility] DDI.  We discussed the risk of worsening paranoia if we reduce the dosage.   He agrees  Follow-up 3 mos  Lynder Parents, MD, DFAPA   Future Appointments  Date Time Provider [place]  08/25/2021  2:30 PM Parrett, Fonnie Mu, NP [facility] None  11/20/2021  9:30 AM Cottle, Billey Co., MD [facility] None        -------------------------------

## 2021-08-25 ENCOUNTER — Ambulatory Visit (INDEPENDENT_AMBULATORY_CARE_PROVIDER_SITE_OTHER): Payer: Medicare Other | Admitting: Adult Health

## 2021-08-25 ENCOUNTER — Encounter: Payer: Self-pay | Admitting: Adult Health

## 2021-08-25 DIAGNOSIS — G4710 Hypersomnia, unspecified: Secondary | ICD-10-CM

## 2021-08-25 DIAGNOSIS — G4733 Obstructive sleep apnea (adult) (pediatric): Secondary | ICD-10-CM

## 2021-08-25 NOTE — Assessment & Plan Note (Addendum)
Severe obstructive sleep apnea with excellent control compliance on nocturnal BiPAP  - discussed how weight can impact sleep and risk for sleep disordered breathing - discussed options to assist with weight loss: combination of diet modification, cardiovascular and strength training exercises   - had an extensive discussion regarding the adverse health consequences related to untreated sleep disordered breathing - specifically discussed the risks for hypertension, coronary artery disease, cardiac dysrhythmias, cerebrovascular disease, and diabetes - lifestyle modification discussed   - discussed how sleep disruption can increase risk of accidents, particularly when driving - safe driving practices were discussed   Plan  Patient Instructions  Keep up the good work Continue on BiPAP at bedtime Work on healthy weight Healthy sleep regimen Do not drive if sleepy Follow up with Dr. Craige Cotta in  1 year and As needed

## 2021-08-25 NOTE — Progress Notes (Signed)
@Patient  ID: , male    DOB: [birth year], 46 y.o.   MRN: [id number]  Chief Complaint  Patient presents with   Follow-up    Referring provider: No ref. provider found  HPI: 46 year old male former  smoker  followed for obstructive sleep apnea Medical history significant for schizoaffective disorder   TEST/EVENTS :  PSG 2010 >> AHI 49  08/25/2021 Follow up : OSA Patient presents for a 1 year follow-up. Patient has OSA on nocturnal BIPAP.  Patient is on BiPAP wears his BiPAP every single night usually for 8-9 hrs . Feels he benefits from BiPAP with improved sleep and decreased daytime sleepiness.  Patient is followed by psychiatry for schizoaffective disorder and is on modafinil for hypersomnia.  He feels that this does help ,.  BiPAP download shows excellent compliance with 100% usage.  Daily average usage at 9 hours.  Patient is on auto BiPAP IPAP max 14 EPAP minimum at 10 cm H2O.  AHI 2.2/hour Uses full face mask.   Was able to quit smoking last month with Chantix .    No Known Allergies  There is no immunization history for the selected administration types on file for this patient.  Past Medical History:  Diagnosis Date   Schizoaffective disorder (HCC)     Tobacco History: Social History   Tobacco Use  Smoking Status Former   Packs/day: 1.00   Years: 31.00   Total pack years: 31.00   Types: Cigarettes   Start date: 03/12/1990   Quit date: 07/25/2021   Years since quitting: 0.0  Smokeless Tobacco Never  Tobacco Comments   Quit smoking 07/2021   Counseling given: Not Answered Tobacco comments: Quit smoking 07/2021   Outpatient Medications Prior to Visit  Medication Sig Dispense Refill   cloZAPine (CLOZARIL) 100 MG tablet TAKE 6 TABLETS BY MOUTH EVERY DAY (Patient taking differently: Take 100 mg by mouth 4 (four) times daily. 4 tabs daily) 180 tablet 5   FLUoxetine (PROZAC) 20 MG capsule TAKE 2 CAPSULES(40 MG) BY MOUTH DAILY 180 capsule 0   lithium  carbonate (ESKALITH) 450 MG CR tablet Take 1 tablet (450 mg total) by mouth 2 (two) times daily. 180 tablet 1   modafinil (PROVIGIL) 200 MG tablet TAKE 1 TABLET BY MOUTH IN THE MORNING AND 1 TABLET AT 2:00 PM 60 tablet 5   propranolol (INDERAL) 20 MG tablet 2-3 tablets twice daily for tremor or anxiety 540 tablet 1   varenicline (CHANTIX) 1 MG tablet 1/2 tablet twice daily for 1 week then 1 tablet twice daily (Patient taking differently: 1 tablet twice daily) 60 tablet 3   lamoTRIgine (LAMICTAL) 150 MG tablet TAKE 1 AND 1/2 TABLETS BY MOUTH DAILY (Patient not taking: Reported on 08/17/2021) 45 tablet 0   No facility-administered medications prior to visit.     Review of Systems:   Constitutional:   No  weight loss, night sweats,  Fevers, chills,  +fatigue, or  lassitude.  HEENT:   No headaches,  Difficulty swallowing,  Tooth/dental problems, or  Sore throat,                No sneezing, itching, ear ache, nasal congestion, post nasal drip,   CV:  No chest pain,  Orthopnea, PND, swelling in lower extremities, anasarca, dizziness, palpitations, syncope.   GI  No heartburn, indigestion, abdominal pain, nausea, vomiting, diarrhea, change in bowel habits, loss of appetite, bloody stools.   Resp: No shortness of breath with exertion or at rest.  No excess mucus, no productive cough,  No non-productive cough,  No coughing up of blood.  No change in color of mucus.  No wheezing.  No chest wall deformity  Skin: no rash or lesions.  GU: no dysuria, change in color of urine, no urgency or frequency.  No flank pain, no hematuria   MS:  No joint pain or swelling.  No decreased range of motion.  No back pain.    Physical Exam  BP 110/70   Pulse 65   Ht 5\' 8"  (1.727 m)   Wt 201 lb 12.8 oz (91.5 kg)   SpO2 97%   BMI 30.68 kg/m   GEN: A/Ox3; pleasant , NAD, well nourished    HEENT:  [place]/AT,  NOSE-clear, THROAT-clear, no lesions, no postnasal drip or exudate noted.   NECK:  Supple w/ fair  ROM; no JVD; normal carotid impulses w/o bruits; no thyromegaly or nodules palpated; no lymphadenopathy.    RESP  Clear  P & A; w/o, wheezes/ rales/ or rhonchi. no accessory muscle use, no dullness to percussion  CARD:  RRR, no m/r/g, no peripheral edema, pulses intact, no cyanosis or clubbing.  GI:   Soft & nt; nml bowel sounds; no organomegaly or masses detected.   Musco: Warm bil, no deformities or joint swelling noted.   Neuro: alert, no focal deficits noted.    Skin: Warm, no lesions or rashes    Lab Results:   BNP No results found for: "BNP"  ProBNP No results found for: "PROBNP"  Imaging: No results found.        No data to display          No results found for: "NITRICOXIDE"      Assessment & Plan:   Obstructive sleep apnea Severe obstructive sleep apnea with excellent control compliance on nocturnal BiPAP  - discussed how weight can impact sleep and risk for sleep disordered breathing - discussed options to assist with weight loss: combination of diet modification, cardiovascular and strength training exercises   - had an extensive discussion regarding the adverse health consequences related to untreated sleep disordered breathing - specifically discussed the risks for hypertension, coronary artery disease, cardiac dysrhythmias, cerebrovascular disease, and diabetes - lifestyle modification discussed   - discussed how sleep disruption can increase risk of accidents, particularly when driving - safe driving practices were discussed   Plan  Patient Instructions  Keep up the good work Continue on BiPAP at bedtime Work on healthy weight Healthy sleep regimen Do not drive if sleepy Follow up with Dr. in  1 year and As needed        Hypersomnia Patient has ongoing hypersomnia spite excellent control and compliance on CPAP.  Has underlying schizoaffective disorder.  Followed by psychiatry on modafinil to help with daytime  sleepiness  Plan  Patient Instructions  Keep up the good work Continue on BiPAP at bedtime Work on healthy weight Healthy sleep regimen Do not drive if sleepy Follow up with Dr. Craige Cotta in  1 year and As needed           Craige Cotta, NP 08/25/2021

## 2021-08-25 NOTE — Patient Instructions (Addendum)
Keep up the good work Continue on BiPAP at bedtime Work on healthy weight Healthy sleep regimen Do not drive if sleepy [place] job not smoking .  Follow up with Dr. Craige Cotta in  1 year and As needed

## 2021-08-25 NOTE — Assessment & Plan Note (Addendum)
Patient has ongoing hypersomnia spite excellent control and compliance on CPAP.  Has underlying schizoaffective disorder.  Followed by psychiatry on modafinil to help with daytime sleepiness  Plan  Patient Instructions  Keep up the good work Continue on BiPAP at bedtime Work on healthy weight Healthy sleep regimen Do not drive if sleepy Follow up with Dr. Craige Cotta in  1 year and As needed

## 2021-08-29 ENCOUNTER — Telehealth: Payer: Self-pay

## 2021-08-29 NOTE — Telephone Encounter (Signed)
Pt's previous PA for Modafinil expired on 08/14/2021, PA submitted for Modafinil 200 mg #60/30 day to [employer], approval received effective through 03/01/2022.  Pharmacy notified.

## 2021-09-25 ENCOUNTER — Encounter: Payer: Self-pay | Admitting: Psychiatry

## 2021-09-25 DIAGNOSIS — F250 Schizoaffective disorder, bipolar type: Secondary | ICD-10-CM | POA: Diagnosis not present

## 2021-09-25 DIAGNOSIS — Z79899 Other long term (current) drug therapy: Secondary | ICD-10-CM | POA: Diagnosis not present

## 2021-10-03 ENCOUNTER — Other Ambulatory Visit: Payer: Self-pay | Admitting: Psychiatry

## 2021-10-03 DIAGNOSIS — F4010 Social phobia, unspecified: Secondary | ICD-10-CM

## 2021-10-03 DIAGNOSIS — F422 Mixed obsessional thoughts and acts: Secondary | ICD-10-CM

## 2021-10-30 DIAGNOSIS — Z79899 Other long term (current) drug therapy: Secondary | ICD-10-CM | POA: Diagnosis not present

## 2021-11-06 ENCOUNTER — Other Ambulatory Visit: Payer: Self-pay

## 2021-11-06 ENCOUNTER — Telehealth: Payer: Self-pay | Admitting: Psychiatry

## 2021-11-06 MED ORDER — VARENICLINE TARTRATE 1 MG PO TABS: ORAL_TABLET | ORAL | 3 refills | Status: DC

## 2021-11-06 NOTE — Telephone Encounter (Signed)
LVM to [facility]. Per note patient was unable to tolerate medication, even at a low dose. Is he wanting to restart/try again?

## 2021-11-06 NOTE — Telephone Encounter (Signed)
Patient called to request a RF on varenicline. Per notes he was unable to tolerate even at low dose. I asked patient about this and he said he was still taking it, had not stopped it, and wanted a RF before he ran out. Rx was sent in 5/25 for #60 x3. I know it isn't controlled but based on dates of Rx, ? If he had been continuously taking.

## 2021-11-06 NOTE — Telephone Encounter (Signed)
Pt RTC  he informed CC he would stop and report if issues this time with med. He has had much better results this time and requesting RF to continue progress.

## 2021-11-06 NOTE — Telephone Encounter (Signed)
Patient called in needing refill for Varenicline 1mg . Ph: [phone] Appt 11/6 Pharmacy [facility] [address] [place], [place]

## 2021-11-20 ENCOUNTER — Ambulatory Visit (INDEPENDENT_AMBULATORY_CARE_PROVIDER_SITE_OTHER): Payer: Medicare Other | Admitting: Psychiatry

## 2021-11-27 ENCOUNTER — Other Ambulatory Visit: Payer: Self-pay

## 2021-11-27 ENCOUNTER — Telehealth: Payer: Self-pay | Admitting: Psychiatry

## 2021-11-27 DIAGNOSIS — F422 Mixed obsessional thoughts and acts: Secondary | ICD-10-CM

## 2021-11-27 MED ORDER — LITHIUM CARBONATE ER 450 MG PO TBCR: 450.0000 mg | EXTENDED_RELEASE_TABLET | Freq: Two times a day (BID) | ORAL | 0 refills | Status: DC

## 2021-11-27 MED ORDER — FLUOXETINE HCL 20 MG PO CAPS: ORAL_CAPSULE | ORAL | 0 refills | Status: DC

## 2021-11-27 NOTE — Telephone Encounter (Signed)
Pended.

## 2021-11-27 NOTE — Telephone Encounter (Signed)
Rx sent 

## 2021-11-27 NOTE — Telephone Encounter (Signed)
Pt made an appointment  for 11/22. He needs his lithium refilled as well as his prozac. Pharmacy is [facility] [address] in [place]

## 2021-12-06 ENCOUNTER — Ambulatory Visit (INDEPENDENT_AMBULATORY_CARE_PROVIDER_SITE_OTHER): Payer: Medicare Other | Admitting: Psychiatry

## 2021-12-06 ENCOUNTER — Encounter: Payer: Self-pay | Admitting: Psychiatry

## 2021-12-06 DIAGNOSIS — F250 Schizoaffective disorder, bipolar type: Secondary | ICD-10-CM | POA: Diagnosis not present

## 2021-12-06 DIAGNOSIS — G253 Myoclonus: Secondary | ICD-10-CM | POA: Diagnosis not present

## 2021-12-06 DIAGNOSIS — G3184 Mild cognitive impairment, so stated: Secondary | ICD-10-CM

## 2021-12-06 DIAGNOSIS — F422 Mixed obsessional thoughts and acts: Secondary | ICD-10-CM | POA: Diagnosis not present

## 2021-12-06 DIAGNOSIS — Z79899 Other long term (current) drug therapy: Secondary | ICD-10-CM | POA: Diagnosis not present

## 2021-12-06 DIAGNOSIS — F4010 Social phobia, unspecified: Secondary | ICD-10-CM | POA: Diagnosis not present

## 2021-12-06 DIAGNOSIS — G4733 Obstructive sleep apnea (adult) (pediatric): Secondary | ICD-10-CM

## 2021-12-06 DIAGNOSIS — F411 Generalized anxiety disorder: Secondary | ICD-10-CM

## 2021-12-06 DIAGNOSIS — F17203 Nicotine dependence unspecified, with withdrawal: Secondary | ICD-10-CM

## 2021-12-06 MED ORDER — LITHIUM CARBONATE ER 450 MG PO TBCR: 450.0000 mg | EXTENDED_RELEASE_TABLET | Freq: Two times a day (BID) | ORAL | 0 refills | Status: DC

## 2021-12-06 MED ORDER — MODAFINIL 200 MG PO TABS: ORAL_TABLET | ORAL | 5 refills | Status: DC

## 2021-12-06 MED ORDER — CLOZAPINE 100 MG PO TABS: 600.0000 mg | ORAL_TABLET | Freq: Every day | ORAL | 5 refills | Status: DC

## 2021-12-06 MED ORDER — VARENICLINE TARTRATE 1 MG PO TABS: ORAL_TABLET | ORAL | 3 refills | Status: DC

## 2021-12-06 MED ORDER — PROPRANOLOL HCL 20 MG PO TABS: ORAL_TABLET | ORAL | 1 refills | Status: DC

## 2021-12-06 MED ORDER — FLUOXETINE HCL 20 MG PO CAPS: ORAL_CAPSULE | ORAL | 0 refills | Status: DC

## 2021-12-06 NOTE — Progress Notes (Signed)
Matthew Reed [id number] 1975/09/02 46 y.o.    Subjective:   Patient ID:  Matthew Reed is a 46 y.o. (DOB 11-11-1975) male.  Chief Complaint:  Chief Complaint  Patient presents with   Follow-up    Schizoaffective disorder, bipolar type ([place])   Depression   Anxiety     HPI  Matthew Reed presents to the office today for follow-up of smoking and schizoaffective disorder, MCI and Se meds.  seen August 2020.  He wanted to have a trial of Wellbutrin for smoking cessation that was initiated 150 mg in the morning to then increase to 300 mg, the usual dose.  Patient called October 5 with the following complaint:Pt is having pretty bad side effects he thinks its from the Lithium he is on. Pt's leg start twitching then he falls down. He said its been going on for awhile, but has got worse.  He was given the following response from our office: Patient's complaints are most consistent with myoclonic jerks.  These can occur with lithium.  They can also occur with clozapine.  They are more common when clozapine dosages are above 600 mg daily and the patient is taking 700 mg daily.  Serum lithium level was 1.0.  Serum BMP is within normal limits including creatinine 1.26 calcium 9.1.  TSH is also normal at 3.1  His lithium level is not high.  Usually lithium related jerks do not lead to falls.  It is more likely this is related to clozapine.  The treatment for clozapine related jerks is lamotrigine.  The last note from the patient states that he has already reduced the lithium by half tablet daily.  If he has done that for over a week then the lithium is not the cause and we need to start the lamotrigine.  We cannot reduce the lithium further.  It typically takes about 1 week for reduction in lithium dosage to lead to reduction in side effects.  If it has been 1 full week then it is necessary to start the low-dose lamotrigine as noted below.  If it has been less than 1 week he can wait until it has  been 1 week and then notify us.  Because it is a Friday I will go ahead and send in the prescription for lamotrigine and he can choose to start it now or defer until he has been on the lower dose of lithium for 1 full week.  Instructions on lamotrigine dosing will be on the prescription.   TC  Reduced lithium to 2 daily about 5 days and the jerks are improved.  [place] a couple of times.  Better now.    Has reduced the clozapine from 700 to 600 mg daily.  He's reduced his smoking by 1/2 or more.  This will tend to increase the blood clozapine.  If the sx continue mid next week then call and may require checking blood level of clozapine or adding lamotrigine.  Wellbutrin has helped reduce the smoking.  There are several issues that could be going on here.  Both lithium and clozapine can clear trigger myoclonic jerks.  He is already reduced the lithium.  He is also reduced to clozapine but he is also reduce smoking which will tend to increase the clozapine blood level.  Clozapine levels above 600 mg a day can trigger myoclonic jerks and potentially dose-related seizures.  Often lamotrigine is used to counteract this problem.  However there have been so many changes lately  that it seems prudent to wait until next week once the lithium levels are more stable in order to better evaluate the situation.  If the jerks continue then we will probably check a blood level of clozapine and also add lamotrigine.  If jerks continue then call next week.  He agrees to this plan.  His lithium level in October before he reduced lithium was 1.0 Since that appointment he has reduced both lithium and clozapine because of jerks.   seen December 04, 2018 and the following was discussed. He just increased the lamotrigine to 100 early November and hasn't seen changes with the jerks.  They are scary bc have caused falls 3-4 times since here but last time was bc tripped.  Legs and arms will jerk.  Less tremor lately with hands.   Legs will get week and shake and not feel strong enough to carry him.  If he tries to lift something over 20# it will happen.  No unusual back problems.  Hx sciatica resolved. Reduced smoking from 40 to 10 daily with Wellbutrin 150 helping.    OK trial Wellbutrin XL  300 mg AM retrial for smoking cessation DT failure of Chantix and nicotine patches.  Disc SE in detail.  Discussed the risk that this could increasing's anxiety or cause paranoia or mania.  Because of the severity of risk of smoking to his overall health it is reasonable to try this. Disc DDI clozapine and smoking.  Evidence suggests 40% lower levels in smokers which may require reducing the clozapine as he quits smoking.  He has reduced from 700 to 500 mg in the last week.   OK reduce clozapine to 400 mg daily.  Disc relapse risk.  He called December 1 reporting the leg jerks were better but not gone and wanted an increase to low lamotrigine to try to help with this problem.  It was increased from 100 to 150 mg daily at that time.  seen January 20, 2019. No problems are worse with reduction in clozapine to 400 mg and sleepiness is better.  Still tends to worry over things too much.  Paranoia still controlled. No mood swings.   Cigarettes 10-12 daily.  Wellbutrin helped him cut back.  Wants to stop. Jerks are 80-90% better and mainly when doing heavy lifting. He wanted to increase the Wellbutrin XL to 450 mg daily to see if he could completely stop smoking and that dose change was made.  03/18/2019 appointment the following is noted: Started having more paranoia and increased clozapine to 500 mg and the paranoia resolved. Forgot to increase Wellbutrin and still smoking 7-10 days.  Working PT and it's good.  Just a few hours weekly.  Gets fatigued if does it too much. Jerks are 80% better and asks about increasing lamotrigine from 225 mg AM. Still preaches and tolerated critique.  No paranoia.  Mood stable.  No mania.  Cut hours PT at work  bc too stressed and that helped.  Patient reports stable mood and denies depressed or irritable moods.   Patient denies difficulty with sleep initiation or maintenance. Sleep 11-1028. Using BiPap. Denies appetite disturbance.  Patient reports that energy and motivation have been good.  Patient denies any difficulty with concentration.  Patient denies any suicidal ideation. Asks about increasing Wellbutrin to the max to try stop smoking completely.  He is aware of the risk of it causing more anxiety and paranoia.   Plan: Increase Wellbutrin XL to 450 mg daily to  maximize benefit for smoking cessation. Increase lamotrigine to 2 daily to help with jerks.  06/17/2019 appointment the following is noted: Took Wellbutrin 450 mg for awhile and was cranky, moody and paranoid and stopped and it resolved off of it. It did help with smoking.  Increased smoking caused increased paranoia and needed to increase clozapine to 600 mg daily from 500 mg daily. Some PT work stressed dealing with the public but usually can handle it if not around people so much. Sleep OK with bipap with good reading.  Machine is 47 years old. Still occ speaks at church. Jerking is a lot better but it still happens.  No falls. Propranolol helps anxiety and tremor. Plan: For the jerks increase lamotrigine to 2 tablets in the morning. Increase Wellbutrin to 3 of the 150 mg tablets each morning. He forgot to do this last visit.  10/21/19 appt noted: Couldn't tolerate Wellbutrin DT moodiness and stopped. CO OCD anxiety compulsively checks for keys and schedule bc fear of losing or forgetting things.  Hides his checking from others.  Occ may have to recheck but obsesses on it.  Wife doesn't notice his OCD.   Anxiety is not too bad otherwise.  Not paranoid.  Not depressed.   Patient reports stable mood and denies depressed or irritable moods.  Patient denies any recent difficulty with anxiety.  Patient denies difficulty with sleep initiation or  maintenance. Denies appetite disturbance.  Patient reports that energy and motivation have been good.  Patient denies any difficulty with concentration.  Patient denies any suicidal ideation. Grinding teeth he thinks at night.   No problems with other meds.   Less leg twitches now.   Gets tired about 2-3 in afternoon. Plan: Consider afternoon modafinil. Yes, 200 mg in AM and 100 mg in PM Wellbutrin made him moody and he had to stop it.  02/22/2020 appointment with following noted: OCD is getting worse.  Repeated checking rituals around eating, not sure about anything.  So frustrating.  Checks lights when driving.   No reason for it to be worse unless stress but doesn't feel more stressed out. Fatigue and wants thyroid checked.  No mood swings.  No mania.  Plan start sertraline up to 100 mg daily for OCD type sx.  04/21/2020 appt noted: Sertraline 100 worked a little for OCD but affecting sex function and concerned about it. Everything else is great. Not having to check things as much but still does.  Still worries.  Does he have keys or schedule.  Doubts about what he is doing.   Taking energy shot drinks bc feels tired. Sleep doc pleased with bipap with good response. Not jittery with energy drinks and doesn't affect sleep. Increase modafinil to 300 mg daily. Plan: Sertraline helped OCD some but sexual SE.  Concerns about DDI with clozapine from fluvoxamine and fluoxetine which both have probably less sex SE.  Least risk probably with fluoxetine so will switch to it.  Disc Ddi and risk sedation. Switch sertraline to fluoxetine 40 mg daily  Reduce sertraline to 1/2 tablet and start fluoxetine 1of the 20 mg capsule daily for 1 week. Then increase fluoxetine to 2 capsules daily and stop sertraline.   05/17/2020 phone call: Patient complaining that clozapine was making him sick excessively sleepy. MD response: He is currently taking clozapine 600 mg in the evening. He reduced the clozapine dose  in 2017 and gradually had worsening anxiety and paranoia at 500 mg daily.  However if he is willing  to take that risk he can reduce the clozapine to 500 mg at night.  06/22/2020 appointment with the following noted: Still OCD sx not any better and no different from when on sertraline.  Checks things repeatedly, schedule locks.   Also still drowsy after reducing the clozapine to 500 mg at night. Seen with wife who said there were couple of instances of paranoia at work.  He mentioned to her a week ago.  She says day to day he seems ok.   Levada Dy concerned about his memory.  He can't remember things and it wears her out.  She has to repeat instructions to him repeatedly.  Can't depend on him to keep up with stuff.   Pulm saw him and his AHI was 0.3. She sees general cognitive decline. Plan: Reduce lamotrigine to 1-1/2 tablets in the morning to see if it helps memory increase modafinil to 2 tablets daily Check vitamin B12 and folate level they were normal.  11/16/2020 appointment with the following noted: Will be watching new grandchild in December. Doing good.  Work PT [employer]. Feel like he's not getting enough oxygen.   Sleep variable.  Paranoia under control. Sleep good and using CPAP.  05/17/21 appt noted: CC tired and forgetful. On clozapine to 600 mg daily.  Asks to reduce. Otherwise good mood and anxiety.   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   Plan: OK trial with reduction clozapine to 500 mg daily DT fatigue and cognive complaints.     08/17/21 appt noted: Pt called early July asking to reduce clozapine again to 400 mg .  Disc risk relapse but he wants to pursue anyway so it was agreed. Not as groggy and tired.  Mind can go blank.  Sometimes a  little more overwhelmed but brief.  No paranoia or irritability. M in Law  with [place] and wife there everyday.   PT work very low stress.  No problems dealing with people. Quit smoking and thinks that enabled him to reduce clozapine. Chantix helped him and off for  4 weeks. Other meds: no lamotrigine, fluoxetine 40, modafinil 200 BID, lihtium 450 BID, propranolol 20 BID and 40 PM. Sleep is pretty good and less groggy on awakening.  At least 8-9 hours.  No sig napping. No falls in a long time until yesterday.  No sig jerks. Plan: OK trial with reduction clozapine to 400 mg daily DT fatigue and cognive complaints.    12/06/21 appt noted: M in law stage 4 cancer and wife helps care for her. Reduced clozapine 400 mg HS and a little harder to go to sleep and less tired in the day.    To bed 11 and up by 9 AM. No anxiety, anger, irritability or anger being worse. Nothing worse with less clozapine. Doing low stress PT job.   Down to 3-4 cig per day with benefit of Chantix.  If misses Chantix then strong craving. Less cough and SOB. Drowsiness is not that bad in the day usually.  Drinking less coffee.   Propranolol helps anxiety and tremor. Uses [employer] Medicare D plan and works well. Focus better in speaking  He reduced the clozapine dose and 2017 and gradually had worsening anxiety and paranoia at 500 mg daily.  Past Psychiatric Medication Trials:  On clozapine 700 since July 2018 to August 2020.  Chantix N and irritable and tired.  Tried twice.  Wellbutrin 300 Haldol dystonia  olanzapine for many years, ziprasidone, risperidone,  benzodiazepine dependence remotely,  modafinil,  propranolol,  fluvoxamine,  Sertraline 100 Aricept no response, Exelon partial response, Nuvigil, Provigil Remote history of abuse of CBZ  Review of Systems:  Review of Systems  Constitutional:  Negative for fatigue.  Respiratory:  Positive for shortness of breath. Negative for cough.   Cardiovascular:  Negative for palpitations.  Genitourinary:        ED and ejac delay  Neurological:  Negative for tremors and weakness.       Myoclonic jerks not evident in the office but reported.  Psychiatric/Behavioral:  Positive for decreased concentration. Negative for agitation,  behavioral problems, confusion, dysphoric mood, hallucinations, self-injury, sleep disturbance and suicidal ideas. The patient is not nervous/anxious and is not hyperactive.   Not usually short of breath.  Medications: I have reviewed the patient's current medications.  Current Outpatient Medications  Medication Sig Dispense Refill   cloZAPine (CLOZARIL) 100 MG tablet TAKE 6 TABLETS BY MOUTH EVERY DAY (Patient taking differently: Take 100 mg by mouth 4 (four) times daily. 4 tabs daily) 180 tablet 5   FLUoxetine (PROZAC) 20 MG capsule TAKE 2 CAPSULES(40 MG) BY MOUTH DAILY 180 capsule 0   lithium carbonate (ESKALITH) 450 MG CR tablet Take 1 tablet (450 mg total) by mouth 2 (two) times daily. 180 tablet 0   modafinil (PROVIGIL) 200 MG tablet TAKE 1 TABLET BY MOUTH IN THE MORNING AND 1 TABLET AT 2:00 PM 60 tablet 5   propranolol (INDERAL) 20 MG tablet 2-3 tablets twice daily for tremor or anxiety 540 tablet 1   varenicline (CHANTIX) 1 MG tablet 1 tablet twice daily 60 tablet 3   No current facility-administered medications for this visit.    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  Tobacco Use   Smoking status: Former    Packs/day: 1.00    Years: 31.00    Total pack years: 31.00    Types: Cigarettes    Start date: 03/12/1990    Quit date: 07/25/2021    Years since quitting: 0.3   Smokeless tobacco: Never   Tobacco comments:    Quit  smoking 07/2021  Vaping Use   Vaping Use: Never used  Substance and Sexual Activity   Alcohol use: No   Drug use: No   Sexual activity: Not on file  Other Topics Concern   Not on file  Social History Narrative   Not on file   Social Determinants of Health   Financial Resource Strain: Not on file  Food Insecurity: Not on file  Transportation Needs: Not on file  Physical Activity: Not on file  Stress: Not on file  Social Connections: Not on file  Intimate Partner Violence: Not on file    Past Medical History, Surgical history, Social history, and Family history were reviewed and updated as appropriate.   Please see review of systems for further details on the patient's review  from today.   Objective:   Physical Exam:  There were no vitals taken for this visit.  Physical Exam Constitutional:      General: He is not in acute distress.    Appearance: He is well-developed.  Musculoskeletal:        General: No deformity.  Neurological:     Mental Status: He is alert and oriented to person, place, and time.     Motor: No tremor.     Coordination: Coordination normal.     Gait: Gait normal.  Psychiatric:        Attention and Perception: Attention and perception normal. He is attentive.        Mood and Affect: Mood is not anxious or depressed. Affect is not labile, blunt, angry or inappropriate.        Speech: Speech normal.        Behavior: Behavior normal.        Thought Content: Thought content normal. Thought content is not paranoid or delusional. Thought content does not include homicidal or suicidal ideation. Thought content does not include suicidal plan.        Cognition and Memory: Memory is not impaired. He does not exhibit impaired recent memory.        Judgment: Judgment normal.     Comments: Insight is good. Anxiety is better     Lab Review:     Component Value Date/Time   NA 141 03/09/2007 2256   K 4.1 03/09/2007 2256   CL 105 03/09/2007 2256   CO2 28  03/09/2007 2256   GLUCOSE 107 (H) 03/09/2007 2256   BUN 7 03/09/2007 2256   CREATININE 0.94 03/09/2007 2256   CALCIUM 9.5 03/09/2007 2256   GFRNONAA >60 03/09/2007 2256   GFRAA  03/09/2007 2256    >60        The eGFR has been calculated using the MDRD equation. This calculation has not been validated in all clinical       Component Value Date/Time   WBC 6.1 01/04/2020 0853   WBC 4.7 03/09/2007 2256   RBC 5.28 01/04/2020 0853   RBC 5.06 03/09/2007 2256   HGB 15.7 01/04/2020 0853   HCT 47.0 01/04/2020 0853   PLT 171 01/04/2020 0853   MCV 89 01/04/2020 0853   MCH 29.7 01/04/2020 0853   MCHC 33.4 01/04/2020 0853   MCHC 35.3 03/09/2007 2256   RDW 13.4 01/04/2020 0853   LYMPHSABS 1.7 01/04/2020 0853   MONOABS 0.4 03/09/2007 2256   EOSABS 0.0 01/04/2020 0853   BASOSABS 0.1 01/04/2020 0853    No results found for: "POCLITH", "LITHIUM"   Lab Results  Component Value Date   VALPROATE 78.4 03/15/2007    09/25/21 lithium level 0.8   .res Assessment: Plan:    Schizoaffective disorder, bipolar type ([place])  Social anxiety disorder  Mixed obsessional thoughts and acts  Generalized anxiety disorder  Obstructive sleep apnea  Mild cognitive impairment  Long term current use of clozapine  Lithium use  Myoclonic jerking  Nicotine dependence with withdrawal, unspecified nicotine product type   He failed to tolerate even the lower dose of Chantix.  Though it was helpful. Disc smoking cessation and vaping concerns.  His schizoaffective disorder symptoms including paranoia and irritability are well controlled with the current medications.  Including the clozapine.  His CBC with differential has been stable and unremarkable.  These have been recorded in the paper chart because it is difficult to send the results to the pharmacy  and our REMS through epic we discussed the side effects of clozapine in detail.    We have tried to mitigate excessive tiredness from clozapine that  through the use of modafinil or Nuvigil with partial success.Consider switch to [facility]. Helped to increase modafinil 400 mg daily.  He's using CPAP regularly. Dr. Halford Chessman checked it out.  For SOB see PCP.  He has mild cognitive impairment is not that severe.  Sertraline helped OCD some but sexual SE.  Concerns about DDI with clozapine from fluvoxamine and fluoxetine which both have probably less sex SE.  Least risk probably with fluoxetine so will switch to it.  Disc Ddi and risk sedation. Continue fluoxetine 40 mg daily  He was having myoclonic jerks in legs and arms likely from clozapine but better and no falls..   Myoclonic jerks are better nearly gone and he wants trial of higher dose.  OK with DC lamotrigine   ANC levels are within normal limits.  Options for labs [facility] or [facility]  TSH and lithium level DT Fatigue.  Done and is OK normal B12 and folate DT worsening cognition June 2022 He and this office are having difficulties dealing with his current lab which is an independent laboratory that does not send results in epic. Continue lithium 450 BID  Consider memantine for cognition if not better.  Disc DDI clozapine and smoking.  Evidence suggests 40% lower levels in smokers which may require reducing the clozapine as he quits smoking.  He has reduced from 700 to 500 mg in the past.  Has done well with reduction clozapine to 400 mg daily DT fatigue and cognive complaints.    Disc relapse risk.  His paranoia had worsened at 400 mg daily previously but DC smoking is probably helping [facility] DDI.  We discussed the risk of worsening paranoia if we reduce the dosage.   He agrees  Follow-up 3 mos  Lynder Parents, MD, DFAPA   No future appointments.       -------------------------------

## 2021-12-14 DIAGNOSIS — Z79899 Other long term (current) drug therapy: Secondary | ICD-10-CM | POA: Diagnosis not present

## 2021-12-15 ENCOUNTER — Encounter: Payer: Self-pay | Admitting: Psychiatry

## 2022-01-10 DIAGNOSIS — Z79899 Other long term (current) drug therapy: Secondary | ICD-10-CM | POA: Diagnosis not present

## 2022-01-11 ENCOUNTER — Encounter: Payer: Self-pay | Admitting: Psychiatry

## 2022-02-14 ENCOUNTER — Encounter: Payer: Self-pay | Admitting: Psychiatry

## 2022-02-14 DIAGNOSIS — Z79899 Other long term (current) drug therapy: Secondary | ICD-10-CM | POA: Diagnosis not present

## 2022-03-08 ENCOUNTER — Encounter: Payer: Self-pay | Admitting: Psychiatry

## 2022-03-08 ENCOUNTER — Ambulatory Visit (INDEPENDENT_AMBULATORY_CARE_PROVIDER_SITE_OTHER): Payer: Medicare Other | Admitting: Psychiatry

## 2022-03-08 DIAGNOSIS — Z79899 Other long term (current) drug therapy: Secondary | ICD-10-CM

## 2022-03-08 DIAGNOSIS — F17203 Nicotine dependence unspecified, with withdrawal: Secondary | ICD-10-CM

## 2022-03-08 DIAGNOSIS — F411 Generalized anxiety disorder: Secondary | ICD-10-CM | POA: Diagnosis not present

## 2022-03-08 DIAGNOSIS — F250 Schizoaffective disorder, bipolar type: Secondary | ICD-10-CM

## 2022-03-08 DIAGNOSIS — G4733 Obstructive sleep apnea (adult) (pediatric): Secondary | ICD-10-CM

## 2022-03-08 DIAGNOSIS — G253 Myoclonus: Secondary | ICD-10-CM

## 2022-03-08 DIAGNOSIS — F422 Mixed obsessional thoughts and acts: Secondary | ICD-10-CM | POA: Diagnosis not present

## 2022-03-08 DIAGNOSIS — G3184 Mild cognitive impairment, so stated: Secondary | ICD-10-CM

## 2022-03-08 DIAGNOSIS — F4010 Social phobia, unspecified: Secondary | ICD-10-CM | POA: Diagnosis not present

## 2022-03-08 MED ORDER — VARENICLINE TARTRATE 1 MG PO TABS: ORAL_TABLET | ORAL | 2 refills | Status: DC

## 2022-03-08 MED ORDER — FLUOXETINE HCL 20 MG PO CAPS: ORAL_CAPSULE | ORAL | 0 refills | Status: DC

## 2022-03-08 MED ORDER — LITHIUM CARBONATE ER 450 MG PO TBCR: 450.0000 mg | EXTENDED_RELEASE_TABLET | Freq: Two times a day (BID) | ORAL | 0 refills | Status: DC

## 2022-03-08 NOTE — Progress Notes (Signed)
Matthew Reed [id number] [birth year] 47 y.o.    Subjective:   Patient ID:  Matthew Reed is a 47 y.o. (DOB 01-08-1976) male.  Chief Complaint:  Chief Complaint  Patient presents with   Follow-up     HPI  Matthew Reed presents to the office today for follow-up of smoking and schizoaffective disorder, MCI and Se meds.  seen August 2020.  He wanted to have a trial of Wellbutrin for smoking cessation that was initiated 150 mg in the morning to then increase to 300 mg, the usual dose.  Patient called October 5 with the following complaint:Pt is having pretty bad side effects he thinks its from the Lithium he is on. Pt's leg start twitching then he falls down. He said its been going on for awhile, but has got worse.  He was given the following response from our office: Patient's complaints are most consistent with myoclonic jerks.  These can occur with lithium.  They can also occur with clozapine.  They are more common when clozapine dosages are above 600 mg daily and the patient is taking 700 mg daily.  Serum lithium level was 1.0.  Serum BMP is within normal limits including creatinine 1.26 calcium 9.1.  TSH is also normal at 3.1  His lithium level is not high.  Usually lithium related jerks do not lead to falls.  It is more likely this is related to clozapine.  The treatment for clozapine related jerks is lamotrigine.  The last note from the patient states that he has already reduced the lithium by half tablet daily.  If he has done that for over a week then the lithium is not the cause and we need to start the lamotrigine.  We cannot reduce the lithium further.  It typically takes about 1 week for reduction in lithium dosage to lead to reduction in side effects.  If it has been 1 full week then it is necessary to start the low-dose lamotrigine as noted below.  If it has been less than 1 week he can wait until it has been 1 week and then notify us.  Because it is a Friday I will go ahead  and send in the prescription for lamotrigine and he can choose to start it now or defer until he has been on the lower dose of lithium for 1 full week.  Instructions on lamotrigine dosing will be on the prescription.   TC  Reduced lithium to 2 daily about 5 days and the jerks are improved.  [place] a couple of times.  Better now.    Has reduced the clozapine from 700 to 600 mg daily.  He's reduced his smoking by 1/2 or more.  This will tend to increase the blood clozapine.  If the sx continue mid next week then call and may require checking blood level of clozapine or adding lamotrigine.  Wellbutrin has helped reduce the smoking.  There are several issues that could be going on here.  Both lithium and clozapine can clear trigger myoclonic jerks.  He is already reduced the lithium.  He is also reduced to clozapine but he is also reduce smoking which will tend to increase the clozapine blood level.  Clozapine levels above 600 mg a day can trigger myoclonic jerks and potentially dose-related seizures.  Often lamotrigine is used to counteract this problem.  However there have been so many changes lately that it seems prudent to wait until next week once the lithium levels are  more stable in order to better evaluate the situation.  If the jerks continue then we will probably check a blood level of clozapine and also add lamotrigine.  If jerks continue then call next week.  He agrees to this plan.  His lithium level in October before he reduced lithium was 1.0 Since that appointment he has reduced both lithium and clozapine because of jerks.   seen December 04, 2018 and the following was discussed. He just increased the lamotrigine to 100 early November and hasn't seen changes with the jerks.  They are scary bc have caused falls 3-4 times since here but last time was bc tripped.  Legs and arms will jerk.  Less tremor lately with hands.  Legs will get week and shake and not feel strong enough to carry him.  If  he tries to lift something over 20# it will happen.  No unusual back problems.  Hx sciatica resolved. Reduced smoking from 40 to 10 daily with Wellbutrin 150 helping.    OK trial Wellbutrin XL  300 mg AM retrial for smoking cessation DT failure of Chantix and nicotine patches.  Disc SE in detail.  Discussed the risk that this could increasing's anxiety or cause paranoia or mania.  Because of the severity of risk of smoking to his overall health it is reasonable to try this. Disc DDI clozapine and smoking.  Evidence suggests 40% lower levels in smokers which may require reducing the clozapine as he quits smoking.  He has reduced from 700 to 500 mg in the last week.   OK reduce clozapine to 400 mg daily.  Disc relapse risk.  He called December 1 reporting the leg jerks were better but not gone and wanted an increase to low lamotrigine to try to help with this problem.  It was increased from 100 to 150 mg daily at that time.  seen January 20, 2019. No problems are worse with reduction in clozapine to 400 mg and sleepiness is better.  Still tends to worry over things too much.  Paranoia still controlled. No mood swings.   Cigarettes 10-12 daily.  Wellbutrin helped him cut back.  Wants to stop. Jerks are 80-90% better and mainly when doing heavy lifting. He wanted to increase the Wellbutrin XL to 450 mg daily to see if he could completely stop smoking and that dose change was made.  03/18/2019 appointment the following is noted: Started having more paranoia and increased clozapine to 500 mg and the paranoia resolved. Forgot to increase Wellbutrin and still smoking 7-10 days.  Working PT and it's good.  Just a few hours weekly.  Gets fatigued if does it too much. Jerks are 80% better and asks about increasing lamotrigine from 225 mg AM. Still preaches and tolerated critique.  No paranoia.  Mood stable.  No mania.  Cut hours PT at work bc too stressed and that helped.  Patient reports stable mood and denies  depressed or irritable moods.   Patient denies difficulty with sleep initiation or maintenance. Sleep 11-1028. Using BiPap. Denies appetite disturbance.  Patient reports that energy and motivation have been good.  Patient denies any difficulty with concentration.  Patient denies any suicidal ideation. Asks about increasing Wellbutrin to the max to try stop smoking completely.  He is aware of the risk of it causing more anxiety and paranoia.   Plan: Increase Wellbutrin XL to 450 mg daily to maximize benefit for smoking cessation. Increase lamotrigine to 2 daily to help with jerks.  06/17/2019 appointment the following is noted: Took Wellbutrin 450 mg for awhile and was cranky, moody and paranoid and stopped and it resolved off of it. It did help with smoking.  Increased smoking caused increased paranoia and needed to increase clozapine to 600 mg daily from 500 mg daily. Some PT work stressed dealing with the public but usually can handle it if not around people so much. Sleep OK with bipap with good reading.  Machine is 48 years old. Still occ speaks at church. Jerking is a lot better but it still happens.  No falls. Propranolol helps anxiety and tremor. Plan: For the jerks increase lamotrigine to 2 tablets in the morning. Increase Wellbutrin to 3 of the 150 mg tablets each morning. He forgot to do this last visit.  10/21/19 appt noted: Couldn't tolerate Wellbutrin DT moodiness and stopped. CO OCD anxiety compulsively checks for keys and schedule bc fear of losing or forgetting things.  Hides his checking from others.  Occ may have to recheck but obsesses on it.  Wife doesn't notice his OCD.   Anxiety is not too bad otherwise.  Not paranoid.  Not depressed.   Patient reports stable mood and denies depressed or irritable moods.  Patient denies any recent difficulty with anxiety.  Patient denies difficulty with sleep initiation or maintenance. Denies appetite disturbance.  Patient reports that energy  and motivation have been good.  Patient denies any difficulty with concentration.  Patient denies any suicidal ideation. Grinding teeth he thinks at night.   No problems with other meds.   Less leg twitches now.   Gets tired about 2-3 in afternoon. Plan: Consider afternoon modafinil. Yes, 200 mg in AM and 100 mg in PM Wellbutrin made him moody and he had to stop it.  02/22/2020 appointment with following noted: OCD is getting worse.  Repeated checking rituals around eating, not sure about anything.  So frustrating.  Checks lights when driving.   No reason for it to be worse unless stress but doesn't feel more stressed out. Fatigue and wants thyroid checked.  No mood swings.  No mania.  Plan start sertraline up to 100 mg daily for OCD type sx.  04/21/2020 appt noted: Sertraline 100 worked a little for OCD but affecting sex function and concerned about it. Everything else is great. Not having to check things as much but still does.  Still worries.  Does he have keys or schedule.  Doubts about what he is doing.   Taking energy shot drinks bc feels tired. Sleep doc pleased with bipap with good response. Not jittery with energy drinks and doesn't affect sleep. Increase modafinil to 300 mg daily. Plan: Sertraline helped OCD some but sexual SE.  Concerns about DDI with clozapine from fluvoxamine and fluoxetine which both have probably less sex SE.  Least risk probably with fluoxetine so will switch to it.  Disc Ddi and risk sedation. Switch sertraline to fluoxetine 40 mg daily  Reduce sertraline to 1/2 tablet and start fluoxetine 1of the 20 mg capsule daily for 1 week. Then increase fluoxetine to 2 capsules daily and stop sertraline.   05/17/2020 phone call: Patient complaining that clozapine was making him sick excessively sleepy. MD response: He is currently taking clozapine 600 mg in the evening. He reduced the clozapine dose in 2017 and gradually had worsening anxiety and paranoia at 500 mg  daily.  However if he is willing to take that risk he can reduce the clozapine to 500 mg at night.  06/22/2020 appointment with the following noted: Still OCD sx not any better and no different from when on sertraline.  Checks things repeatedly, schedule locks.   Also still drowsy after reducing the clozapine to 500 mg at night. Seen with wife who said there were couple of instances of paranoia at work.  He mentioned to her a week ago.  She says day to day he seems ok.   Levada Dy concerned about his memory.  He can't remember things and it wears her out.  She has to repeat instructions to him repeatedly.  Can't depend on him to keep up with stuff.   Pulm saw him and his AHI was 0.3. She sees general cognitive decline. Plan: Reduce lamotrigine to 1-1/2 tablets in the morning to see if it helps memory increase modafinil to 2 tablets daily Check vitamin B12 and folate level they were normal.  11/16/2020 appointment with the following noted: Will be watching new grandchild in December. Doing good.  Work PT [employer]. Feel like he's not getting enough oxygen.   Sleep variable.  Paranoia under control. Sleep good and using CPAP.  05/17/21 appt noted: CC tired and forgetful. On clozapine to 600 mg daily.  Asks to reduce. Otherwise good mood and anxiety.   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   Plan: OK trial with reduction clozapine to 500 mg daily DT fatigue and cognive complaints.     08/17/21 appt noted: Pt called early July asking to reduce clozapine again to 400 mg .  Disc risk relapse but he wants to pursue anyway so it was agreed. Not as groggy and tired.  Mind can go blank.  Sometimes a  little more overwhelmed but brief.  No paranoia or irritability. M in Law  with [place] and wife there everyday.   PT work very low stress.  No problems dealing with people. Quit smoking and thinks that enabled him to reduce clozapine. Chantix helped him and off for 4 weeks. Other meds: no lamotrigine, fluoxetine 40, modafinil 200  BID, lihtium 450 BID, propranolol 20 BID and 40 PM. Sleep is pretty good and less groggy on awakening.  At least 8-9 hours.  No sig napping. No falls in a long time until yesterday.  No sig jerks. Plan: OK trial with reduction clozapine to 400 mg daily DT fatigue and cognive complaints.    12/06/21 appt noted: M in law stage 4 cancer and wife helps care for her. Reduced clozapine 400 mg HS and a little harder to go to sleep and less tired in the day.    To bed 11 and up by 9 AM. No anxiety, anger, irritability or anger being worse. Nothing worse with less clozapine. Doing low stress PT job.   Down to 3-4 cig per day with benefit of Chantix.  If misses Chantix then strong craving. Less cough and SOB. Drowsiness is not that bad in the day usually.  Drinking less coffee.   Propranolol helps anxiety and tremor. Uses [employer] Medicare D plan and works well. Focus better in speaking  03/08/22 appt noted: Quit smoking for 2 weeks.  Wife quitting too with patches. Doing good overall.  No paranoia or mood swings.   No problems with meds other than sleepiness.   Still active at church.  Has lost some leaders in church.  His father comes to church some.     Kids: 70, 17, 16, 14  He reduced the clozapine dose and 2017 and gradually had worsening anxiety and paranoia at 500 mg  daily.  Past Psychiatric Medication Trials:  On clozapine 700 since July 2018 to August 2020.  Chantix N and irritable and tired.  Tried third time and doing better with it 2024.  Wellbutrin 300 Haldol dystonia  olanzapine for many years, ziprasidone, risperidone,  benzodiazepine dependence remotely, modafinil,  propranolol,  fluvoxamine,  Sertraline 100 Aricept no response, Exelon partial response, Nuvigil, Provigil Remote history of abuse of CBZ  Review of Systems:  Review of Systems  Constitutional:  Negative for fatigue.  Respiratory:  Positive for shortness of breath. Negative for cough.   Cardiovascular:   Negative for palpitations.  Genitourinary:        ED and ejac delay  Neurological:  Negative for tremors.       Myoclonic jerks not evident in the office but reported.  Psychiatric/Behavioral:  Positive for decreased concentration. Negative for agitation, behavioral problems, confusion, dysphoric mood, hallucinations, self-injury, sleep disturbance and suicidal ideas. The patient is not nervous/anxious and is not hyperactive.   Not usually short of breath.  Medications: I have reviewed the patient's current medications.  Current Outpatient Medications  Medication Sig Dispense Refill   cloZAPine (CLOZARIL) 100 MG tablet Take 6 tablets (600 mg total) by mouth at bedtime. 180 tablet 5   FLUoxetine (PROZAC) 20 MG capsule TAKE 2 CAPSULES(40 MG) BY MOUTH DAILY 180 capsule 0   lithium carbonate (ESKALITH) 450 MG CR tablet Take 1 tablet (450 mg total) by mouth 2 (two) times daily. 180 tablet 0   modafinil (PROVIGIL) 200 MG tablet TAKE 1 TABLET BY MOUTH IN THE MORNING AND 1 TABLET AT 2:00 PM 60 tablet 5   propranolol (INDERAL) 20 MG tablet 2-3 tablets twice daily for tremor or anxiety 540 tablet 1   varenicline (CHANTIX) 1 MG tablet 1 tablet twice daily 60 tablet 3   No current facility-administered medications for this visit.    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  Tobacco Use   Smoking status: Former    Packs/day: 1.00    Years: 31.00     Total pack years: 31.00    Types: Cigarettes    Start date: 03/12/1990    Quit date: 07/25/2021    Years since quitting: 0.6   Smokeless tobacco: Never   Tobacco comments:    Quit smoking 07/2021  Vaping Use   Vaping Use: Never used  Substance and Sexual Activity   Alcohol use: No   Drug use: No   Sexual activity: Not on file  Other Topics Concern   Not on file  Social History Narrative   Not on file   Social Determinants of Health   Financial Resource Strain: Not on file  Food Insecurity: Not on file  Transportation Needs: Not on file  Physical Activity: Not on file  Stress: Not on file  Social Connections: Not on file  Intimate Partner Violence: Not on file    Past Medical History, Surgical history, Social history, and Family history were reviewed and updated as appropriate.   Please see review of systems for further details on the patient's review from today.   Objective:   Physical Exam:  There were no vitals taken for this visit.  Physical Exam Constitutional:      General: He is not in acute distress.    Appearance: He is well-developed.  Musculoskeletal:        General: No deformity.  Neurological:     Mental Status: He is alert and oriented to person, place, and time.     Motor: No tremor.     Coordination: Coordination normal.     Gait: Gait normal.  Psychiatric:        Attention and Perception: Attention and perception normal. He is attentive.        Mood and Affect: Mood is not anxious or depressed. Affect is not labile, angry or inappropriate.        Speech: Speech normal.        Behavior: Behavior normal.        Thought Content: Thought content normal. Thought content is not paranoid or delusional. Thought content does not include homicidal or suicidal ideation. Thought content does not include suicidal plan.        Cognition and Memory: Memory is not impaired. He does not exhibit impaired recent memory.        Judgment: Judgment normal.      Comments: Insight is good. Anxiety is better     Lab Review:     Component Value Date/Time   NA 141 03/09/2007 2256   K 4.1 03/09/2007 2256   CL 105 03/09/2007 2256   CO2 28 03/09/2007 2256   GLUCOSE 107 (H) 03/09/2007 2256   BUN 7 03/09/2007 2256   CREATININE 0.94 03/09/2007 2256   CALCIUM 9.5 03/09/2007 2256   GFRNONAA >60 03/09/2007 2256   GFRAA  03/09/2007 2256    >60        The eGFR has been calculated using the MDRD equation. This calculation has not been validated in all clinical       Component Value Date/Time   WBC 6.1 01/04/2020 0853   WBC 4.7 03/09/2007 2256   RBC 5.28 01/04/2020 0853   RBC 5.06 03/09/2007 2256   HGB 15.7 01/04/2020 0853   HCT 47.0 01/04/2020 0853   PLT 171 01/04/2020 0853   MCV 89 01/04/2020 0853   MCH 29.7 01/04/2020 0853   MCHC 33.4 01/04/2020 0853   MCHC 35.3 03/09/2007 2256   RDW 13.4 01/04/2020 0853   LYMPHSABS 1.7 01/04/2020 0853   MONOABS 0.4 03/09/2007 2256   EOSABS 0.0 01/04/2020 0853   BASOSABS 0.1 01/04/2020 0853    No results found for: "POCLITH", "LITHIUM"   Lab Results  Component Value Date   VALPROATE 78.4 03/15/2007    09/25/21 lithium level 0.8   .res Assessment: Plan:    Schizoaffective disorder, bipolar type ([place])  Social anxiety disorder  Mixed obsessional thoughts and acts  Generalized anxiety disorder  Obstructive sleep apnea  Mild cognitive impairment  Long term current use of clozapine  Lithium use  Myoclonic jerking   Disc smoking cessation.  He's stopped completely with Chantix for 2 weeks.  His schizoaffective disorder symptoms including paranoia and irritability are well controlled with the current medications.  Including the clozapine.  His CBC with differential has been stable and unremarkable.  These have been recorded in the paper chart because it is difficult to send the results to the pharmacy and our REMS through epic we discussed the side effects of clozapine in detail.    We  have tried to mitigate excessive tiredness from clozapine that through the use of modafinil or Nuvigil with partial success.Consider switch to [facility]. Helped to increase modafinil 400 mg daily.  He's using CPAP regularly. Dr. Halford Chessman checked it out.  For SOB see PCP.  He has mild cognitive impairment is better with less clozapine and modafinil  Sertraline helped OCD some but sexual SE.  Concerns about DDI with clozapine from fluvoxamine and fluoxetine which both have probably less sex SE.  Least risk probably with fluoxetine so will switch to it.  Disc Ddi and risk sedation. Continue fluoxetine 40 mg daily  He was having myoclonic jerks in legs and arms likely from clozapine but better and no falls..   Myoclonic jerks are better nearly gone and he wants trial of higher dose.  OK with DC lamotrigine   ANC levels are within normal limits.  Options for labs [facility] or [facility]  TSH and lithium level DT Fatigue.  Done and is OK normal B12 and folate DT worsening cognition June 2022 He and this office are having difficulties dealing with his current lab which is an independent laboratory that does not send results in epic. Continue lithium 450 BID  Consider memantine for cognition if not better.  Disc DDI clozapine and smoking.  Evidence suggests 40% lower levels in smokers which may require reducing the clozapine as he quits smoking.  He has reduced from 700 to 500 mg in the past. Disc Chantix and smoking cessation.   If stays off smoking then may be able to reduce clozapine again.  He feels a little more drowsy  Has done well with reduction clozapine to 400 mg daily DT fatigue and cognive complaints.    Disc relapse risk.  His paranoia had worsened at 400 mg daily previously but DC smoking is probably helping [facility] DDI.  We discussed the risk of worsening paranoia if we reduce the dosage.   He agrees  Follow-up 3 mos  Lynder Parents, MD, DFAPA   No future appointments.         -------------------------------

## 2022-03-09 ENCOUNTER — Other Ambulatory Visit: Payer: Self-pay | Admitting: Psychiatry

## 2022-03-09 DIAGNOSIS — F422 Mixed obsessional thoughts and acts: Secondary | ICD-10-CM

## 2022-03-27 DIAGNOSIS — Z79899 Other long term (current) drug therapy: Secondary | ICD-10-CM | POA: Diagnosis not present

## 2022-03-28 ENCOUNTER — Encounter: Payer: Self-pay | Admitting: Psychiatry

## 2022-04-05 ENCOUNTER — Telehealth: Payer: Self-pay | Admitting: Psychiatry

## 2022-04-05 NOTE — Telephone Encounter (Signed)
Matthew Reed called at 1:20 to report that the Clozapine was causing him to be droggy and tired all the time.  He would like to reduce the dose to 1.  Please call to discuss.

## 2022-04-05 NOTE — Telephone Encounter (Signed)
He can reduce clozapine 100 mg tablets from 4 nightly to 3 nightly.  It must be reduced slowly otherwise paranoia will recur.

## 2022-04-05 NOTE — Telephone Encounter (Signed)
Patient c/o being too drowsy with 400 mg clozapine. He is asking to cut down to 300 mg. He said he had not smoked in 2 months. He said if he started having sx he would increase it. He currently has RF for 600 mg.   Disc DDI clozapine and smoking.  Evidence suggests 40% lower levels in smokers which may require reducing the clozapine as he quits smoking.  He has reduced from 700 to 500 mg in the past. Disc Chantix and smoking cessation.   If stays off smoking then may be able to reduce clozapine again.  He feels a little more drowsy   Has done well with reduction clozapine to 400 mg daily DT fatigue and cognive complaints.    Disc relapse risk.  His paranoia had worsened at 400 mg daily previously but DC smoking is probably helping [facility] DDI.  We discussed the risk of worsening paranoia if we reduce the dosage.   He agrees

## 2022-04-06 NOTE — Telephone Encounter (Signed)
Patient notified

## 2022-04-23 DIAGNOSIS — L02213 Cutaneous abscess of chest wall: Secondary | ICD-10-CM | POA: Diagnosis not present

## 2022-05-04 ENCOUNTER — Encounter: Payer: Self-pay | Admitting: Psychiatry

## 2022-05-04 DIAGNOSIS — Z79899 Other long term (current) drug therapy: Secondary | ICD-10-CM | POA: Diagnosis not present

## 2022-05-25 ENCOUNTER — Other Ambulatory Visit: Payer: Self-pay | Admitting: Psychiatry

## 2022-05-25 DIAGNOSIS — G4733 Obstructive sleep apnea (adult) (pediatric): Secondary | ICD-10-CM

## 2022-05-25 DIAGNOSIS — F17203 Nicotine dependence unspecified, with withdrawal: Secondary | ICD-10-CM

## 2022-06-06 ENCOUNTER — Encounter: Payer: Self-pay | Admitting: Psychiatry

## 2022-06-06 ENCOUNTER — Ambulatory Visit (INDEPENDENT_AMBULATORY_CARE_PROVIDER_SITE_OTHER): Payer: Medicare Other | Admitting: Psychiatry

## 2022-06-06 DIAGNOSIS — F411 Generalized anxiety disorder: Secondary | ICD-10-CM

## 2022-06-06 DIAGNOSIS — F250 Schizoaffective disorder, bipolar type: Secondary | ICD-10-CM | POA: Diagnosis not present

## 2022-06-06 DIAGNOSIS — G4733 Obstructive sleep apnea (adult) (pediatric): Secondary | ICD-10-CM

## 2022-06-06 DIAGNOSIS — F422 Mixed obsessional thoughts and acts: Secondary | ICD-10-CM | POA: Diagnosis not present

## 2022-06-06 DIAGNOSIS — F4010 Social phobia, unspecified: Secondary | ICD-10-CM | POA: Diagnosis not present

## 2022-06-06 DIAGNOSIS — G253 Myoclonus: Secondary | ICD-10-CM

## 2022-06-06 DIAGNOSIS — G3184 Mild cognitive impairment, so stated: Secondary | ICD-10-CM

## 2022-06-06 DIAGNOSIS — Z79899 Other long term (current) drug therapy: Secondary | ICD-10-CM

## 2022-06-06 MED ORDER — FLUOXETINE HCL 20 MG PO CAPS: ORAL_CAPSULE | ORAL | 1 refills | Status: DC

## 2022-06-06 MED ORDER — LITHIUM CARBONATE ER 450 MG PO TBCR
450.0000 mg | EXTENDED_RELEASE_TABLET | Freq: Two times a day (BID) | ORAL | 1 refills | Status: DC
Start: 2022-06-06 — End: 2022-08-26

## 2022-06-06 MED ORDER — CLOZAPINE 100 MG PO TABS: 400.0000 mg | ORAL_TABLET | Freq: Every day | ORAL | 5 refills | Status: DC

## 2022-06-06 NOTE — Progress Notes (Signed)
Matthew Reed [id number] December 08, 1975 47 y.o.    Subjective:   Patient ID:  Matthew Reed is a 47 y.o. (DOB Jan 26, 1975) male.  Chief Complaint:  Chief Complaint  Patient presents with   Follow-up     HPI  Matthew Reed presents to the office today for follow-up of smoking and schizoaffective disorder, MCI and Se meds.  seen August 2020.  He wanted to have a trial of Wellbutrin for smoking cessation that was initiated 150 mg in the morning to then increase to 300 mg, the usual dose.  Patient called October 5 with the following complaint:Pt is having pretty bad side effects he thinks its from the Lithium he is on. Pt's leg start twitching then he falls down. He said its been going on for awhile, but has got worse.  He was given the following response from our office: Patient's complaints are most consistent with myoclonic jerks.  These can occur with lithium.  They can also occur with clozapine.  They are more common when clozapine dosages are above 600 mg daily and the patient is taking 700 mg daily.  Serum lithium level was 1.0.  Serum BMP is within normal limits including creatinine 1.26 calcium 9.1.  TSH is also normal at 3.1  His lithium level is not high.  Usually lithium related jerks do not lead to falls.  It is more likely this is related to clozapine.  The treatment for clozapine related jerks is lamotrigine.  The last note from the patient states that he has already reduced the lithium by half tablet daily.  If he has done that for over a week then the lithium is not the cause and we need to start the lamotrigine.  We cannot reduce the lithium further.  It typically takes about 1 week for reduction in lithium dosage to lead to reduction in side effects.  If it has been 1 full week then it is necessary to start the low-dose lamotrigine as noted below.  If it has been less than 1 week he can wait until it has been 1 week and then notify us.  Because it is a Friday I will go ahead  and send in the prescription for lamotrigine and he can choose to start it now or defer until he has been on the lower dose of lithium for 1 full week.  Instructions on lamotrigine dosing will be on the prescription.   TC  Reduced lithium to 2 daily about 5 days and the jerks are improved.  Larey Seat a couple of times.  Better now.    Has reduced the clozapine from 700 to 600 mg daily.  He's reduced his smoking by 1/2 or more.  This will tend to increase the blood clozapine.  If the sx continue mid next week then call and may require checking blood level of clozapine or adding lamotrigine.  Wellbutrin has helped reduce the smoking.  There are several issues that could be going on here.  Both lithium and clozapine can clear trigger myoclonic jerks.  He is already reduced the lithium.  He is also reduced to clozapine but he is also reduce smoking which will tend to increase the clozapine blood level.  Clozapine levels above 600 mg a day can trigger myoclonic jerks and potentially dose-related seizures.  Often lamotrigine is used to counteract this problem.  However there have been so many changes lately that it seems prudent to wait until next week once the lithium levels are  more stable in order to better evaluate the situation.  If the jerks continue then we will probably check a blood level of clozapine and also add lamotrigine.  If jerks continue then call next week.  He agrees to this plan.  His lithium level in October before he reduced lithium was 1.0 Since that appointment he has reduced both lithium and clozapine because of jerks.   seen December 04, 2018 and the following was discussed. He just increased the lamotrigine to 100 early November and hasn't seen changes with the jerks.  They are scary bc have caused falls 3-4 times since here but last time was bc tripped.  Legs and arms will jerk.  Less tremor lately with hands.  Legs will get week and shake and not feel strong enough to carry him.  If  he tries to lift something over 20# it will happen.  No unusual back problems.  Hx sciatica resolved. Reduced smoking from 40 to 10 daily with Wellbutrin 150 helping.    OK trial Wellbutrin XL  300 mg AM retrial for smoking cessation DT failure of Chantix and nicotine patches.  Disc SE in detail.  Discussed the risk that this could increasing's anxiety or cause paranoia or mania.  Because of the severity of risk of smoking to his overall health it is reasonable to try this. Disc DDI clozapine and smoking.  Evidence suggests 40% lower levels in smokers which may require reducing the clozapine as he quits smoking.  He has reduced from 700 to 500 mg in the last week.   OK reduce clozapine to 400 mg daily.  Disc relapse risk.  He called December 1 reporting the leg jerks were better but not gone and wanted an increase to low lamotrigine to try to help with this problem.  It was increased from 100 to 150 mg daily at that time.  seen January 20, 2019. No problems are worse with reduction in clozapine to 400 mg and sleepiness is better.  Still tends to worry over things too much.  Paranoia still controlled. No mood swings.   Cigarettes 10-12 daily.  Wellbutrin helped him cut back.  Wants to stop. Jerks are 80-90% better and mainly when doing heavy lifting. He wanted to increase the Wellbutrin XL to 450 mg daily to see if he could completely stop smoking and that dose change was made.  03/18/2019 appointment the following is noted: Started having more paranoia and increased clozapine to 500 mg and the paranoia resolved. Forgot to increase Wellbutrin and still smoking 7-10 days.  Working PT and it's good.  Just a few hours weekly.  Gets fatigued if does it too much. Jerks are 80% better and asks about increasing lamotrigine from 225 mg AM. Still preaches and tolerated critique.  No paranoia.  Mood stable.  No mania.  Cut hours PT at work bc too stressed and that helped.  Patient reports stable mood and denies  depressed or irritable moods.   Patient denies difficulty with sleep initiation or maintenance. Sleep 11-1028. Using BiPap. Denies appetite disturbance.  Patient reports that energy and motivation have been good.  Patient denies any difficulty with concentration.  Patient denies any suicidal ideation. Asks about increasing Wellbutrin to the max to try stop smoking completely.  He is aware of the risk of it causing more anxiety and paranoia.   Plan: Increase Wellbutrin XL to 450 mg daily to maximize benefit for smoking cessation. Increase lamotrigine to 2 daily to help with jerks.  06/17/2019 appointment the following is noted: Took Wellbutrin 450 mg for awhile and was cranky, moody and paranoid and stopped and it resolved off of it. It did help with smoking.  Increased smoking caused increased paranoia and needed to increase clozapine to 600 mg daily from 500 mg daily. Some PT work stressed dealing with the public but usually can handle it if not around people so much. Sleep OK with bipap with good reading.  Machine is 48 years old. Still occ speaks at church. Jerking is a lot better but it still happens.  No falls. Propranolol helps anxiety and tremor. Plan: For the jerks increase lamotrigine to 2 tablets in the morning. Increase Wellbutrin to 3 of the 150 mg tablets each morning. He forgot to do this last visit.  10/21/19 appt noted: Couldn't tolerate Wellbutrin DT moodiness and stopped. CO OCD anxiety compulsively checks for keys and schedule bc fear of losing or forgetting things.  Hides his checking from others.  Occ may have to recheck but obsesses on it.  Wife doesn't notice his OCD.   Anxiety is not too bad otherwise.  Not paranoid.  Not depressed.   Patient reports stable mood and denies depressed or irritable moods.  Patient denies any recent difficulty with anxiety.  Patient denies difficulty with sleep initiation or maintenance. Denies appetite disturbance.  Patient reports that energy  and motivation have been good.  Patient denies any difficulty with concentration.  Patient denies any suicidal ideation. Grinding teeth he thinks at night.   No problems with other meds.   Less leg twitches now.   Gets tired about 2-3 in afternoon. Plan: Consider afternoon modafinil. Yes, 200 mg in AM and 100 mg in PM Wellbutrin made him moody and he had to stop it.  02/22/2020 appointment with following noted: OCD is getting worse.  Repeated checking rituals around eating, not sure about anything.  So frustrating.  Checks lights when driving.   No reason for it to be worse unless stress but doesn't feel more stressed out. Fatigue and wants thyroid checked.  No mood swings.  No mania.  Plan start sertraline up to 100 mg daily for OCD type sx.  04/21/2020 appt noted: Sertraline 100 worked a little for OCD but affecting sex function and concerned about it. Everything else is great. Not having to check things as much but still does.  Still worries.  Does he have keys or schedule.  Doubts about what he is doing.   Taking energy shot drinks bc feels tired. Sleep doc pleased with bipap with good response. Not jittery with energy drinks and doesn't affect sleep. Increase modafinil to 300 mg daily. Plan: Sertraline helped OCD some but sexual SE.  Concerns about DDI with clozapine from fluvoxamine and fluoxetine which both have probably less sex SE.  Least risk probably with fluoxetine so will switch to it.  Disc Ddi and risk sedation. Switch sertraline to fluoxetine 40 mg daily  Reduce sertraline to 1/2 tablet and start fluoxetine 1of the 20 mg capsule daily for 1 week. Then increase fluoxetine to 2 capsules daily and stop sertraline.   05/17/2020 phone call: Patient complaining that clozapine was making him sick excessively sleepy. MD response: He is currently taking clozapine 600 mg in the evening. He reduced the clozapine dose in 2017 and gradually had worsening anxiety and paranoia at 500 mg  daily.  However if he is willing to take that risk he can reduce the clozapine to 500 mg at night.  06/22/2020 appointment with the following noted: Still OCD sx not any better and no different from when on sertraline.  Checks things repeatedly, schedule locks.   Also still drowsy after reducing the clozapine to 500 mg at night. Seen with wife who said there were couple of instances of paranoia at work.  He mentioned to her a week ago.  She says day to day he seems ok.   Marylene Land concerned about his memory.  He can't remember things and it wears her out.  She has to repeat instructions to him repeatedly.  Can't depend on him to keep up with stuff.   Pulm saw him and his AHI was 0.3. She sees general cognitive decline. Plan: Reduce lamotrigine to 1-1/2 tablets in the morning to see if it helps memory increase modafinil to 2 tablets daily Check vitamin B12 and folate level they were normal.  11/16/2020 appointment with the following noted: Will be watching new grandchild in December. Doing good.  Work PT [employer]. Feel like he's not getting enough oxygen.   Sleep variable.  Paranoia under control. Sleep good and using CPAP.  05/17/21 appt noted: CC tired and forgetful. On clozapine to 600 mg daily.  Asks to reduce. Otherwise good mood and anxiety.   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   Plan: OK trial with reduction clozapine to 500 mg daily DT fatigue and cognive complaints.     08/17/21 appt noted: Pt called early July asking to reduce clozapine again to 400 mg .  Disc risk relapse but he wants to pursue anyway so it was agreed. Not as groggy and tired.  Mind can go blank.  Sometimes a  little more overwhelmed but brief.  No paranoia or irritability. M in Law  with [place] and wife there everyday.   PT work very low stress.  No problems dealing with people. Quit smoking and thinks that enabled him to reduce clozapine. Chantix helped him and off for 4 weeks. Other meds: no lamotrigine, fluoxetine 40, modafinil 200  BID, lihtium 450 BID, propranolol 20 BID and 40 PM. Sleep is pretty good and less groggy on awakening.  At least 8-9 hours.  No sig napping. No falls in a long time until yesterday.  No sig jerks. Plan: OK trial with reduction clozapine to 400 mg daily DT fatigue and cognive complaints.    12/06/21 appt noted: M in law stage 4 cancer and wife helps care for her. Reduced clozapine 400 mg HS and a little harder to go to sleep and less tired in the day.    To bed 11 and up by 9 AM. No anxiety, anger, irritability or anger being worse. Nothing worse with less clozapine. Doing low stress PT job.   Down to 3-4 cig per day with benefit of Chantix.  If misses Chantix then strong craving. Less cough and SOB. Drowsiness is not that bad in the day usually.  Drinking less coffee.   Propranolol helps anxiety and tremor. Uses [employer] Medicare D plan and works well. Focus better in speaking  03/08/22 appt noted: Quit smoking for 2 weeks.  Wife quitting too with patches. Doing good overall.  No paranoia or mood swings.   No problems with meds other than sleepiness.   Still active at church.  Has lost some leaders in church.  His father comes to church some.    04/05/22 TC with staff:  Patient c/o being too drowsy with 400 mg clozapine. He is asking to cut down to 300 mg. He  said he had not smoked in 2 months. He said if he started having sx he would increase it. He currently has RF for 600 mg.     MD resp:  He can reduce clozapine 100 mg tablets from 4 nightly to 3 nightly.  It must be reduced slowly otherwise paranoia will recur.     06/06/22 appt noted: Meds: clozaine 300 mg HS,  No smoking for over 2 mos.  Chantix helps.   If misses more craving cig.   Wife also quit smoking.  Saving money No problems with reduction in clozapine to 300 mg HS. Less sleepiness and tiredness.  No mood swings.  No sig paranoia. Anxiety manageable.   No complaints. Sleep about 11-9.   No depression.  Family doing good.   Parents still living.    Kids: 5, 17, 25, 59 GSON sees weekly.  They live in [place].  He reduced the clozapine dose and 2017 and gradually had worsening anxiety and paranoia at 500 mg daily.  Past Psychiatric Medication Trials:  On clozapine 700 since July 2018 to August 2020.  Chantix N and irritable and tired.  Tried third time and doing better with it 2024.  Wellbutrin 300 Haldol dystonia  olanzapine for many years, ziprasidone, risperidone,  benzodiazepine dependence remotely, modafinil,  propranolol,  fluvoxamine,  Sertraline 100 Aricept no response, Exelon partial response, Nuvigil, Provigil Remote history of abuse of CBZ  Review of Systems:  Review of Systems  Constitutional:  Negative for fatigue.  Respiratory:  Positive for shortness of breath. Negative for cough.   Cardiovascular:  Negative for palpitations.  Genitourinary:        ED and ejac delay  Neurological:  Negative for tremors.       Myoclonic jerks not evident in the office but reported.  Psychiatric/Behavioral:  Positive for decreased concentration. Negative for agitation, behavioral problems, confusion, dysphoric mood, hallucinations, self-injury, sleep disturbance and suicidal ideas. The patient is not nervous/anxious and is not hyperactive.   Not usually short of breath.  Medications: I have reviewed the patient's current medications.  Current Outpatient Medications  Medication Sig Dispense Refill   modafinil (PROVIGIL) 200 MG tablet TAKE 1 TABLET BY MOUTH IN THE MORNING AND AT 2 PM 180 tablet 1   propranolol (INDERAL) 20 MG tablet 2-3 tablets twice daily for tremor or anxiety 540 tablet 1   varenicline (CHANTIX) 1 MG tablet TAKE 1 TABLET BY MOUTH TWICE DAILY 180 tablet 0   cloZAPine (CLOZARIL) 100 MG tablet Take 4 tablets (400 mg total) by mouth at bedtime. 120 tablet 5   FLUoxetine (PROZAC) 20 MG capsule TAKE 2 CAPSULES(40 MG) BY MOUTH DAILY 180 capsule 1   lithium carbonate (ESKALITH) 450 MG ER tablet  Take 1 tablet (450 mg total) by mouth 2 (two) times daily. 180 tablet 1   No current facility-administered medications for this visit.    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  Tobacco Use   Smoking status: Former    Packs/day: 1.00    Years: 31.00    Additional pack years: 0.00    Total pack years: 31.00    Types: Cigarettes    Start date: 03/12/1990    Quit date: 07/25/2021    Years since quitting: 0.8   Smokeless tobacco: Never   Tobacco comments:    Quit smoking 07/2021  Vaping Use   Vaping Use: Never used  Substance and Sexual Activity   Alcohol use: No   Drug use: No   Sexual activity: Not on file  Other Topics Concern   Not on file  Social History Narrative   Not on file   Social Determinants of Health   Financial Resource Strain: Not on file  Food Insecurity: Not on file  Transportation Needs: Not on file  Physical Activity: Not on file  Stress: Not on file  Social Connections: Not on file  Intimate Partner Violence: Not on file    Past Medical History, Surgical history, Social history, and Family history were reviewed and updated as appropriate.   Please see review of systems for further details on the patient's review from today.   Objective:   Physical Exam:  There were no vitals taken for this visit.  Physical Exam Constitutional:      General: He is not in acute distress.    Appearance: He is  well-developed.  Musculoskeletal:        General: No deformity.  Neurological:     Mental Status: He is alert and oriented to person, place, and time.     Motor: No tremor.     Coordination: Coordination normal.     Gait: Gait normal.  Psychiatric:        Attention and Perception: Attention and perception normal. He is attentive.        Mood and Affect: Mood is not anxious or depressed. Affect is not labile or angry.        Speech: Speech normal.        Behavior: Behavior normal.        Thought Content: Thought content normal. Thought content is not paranoid or delusional. Thought content does not include homicidal or suicidal ideation. Thought content does not include suicidal plan.        Cognition and Memory: Memory is not impaired. He does not exhibit impaired recent memory.        Judgment: Judgment normal.     Comments: Insight is good. Anxiety is better No paranoia.     Lab Review:     Component Value Date/Time   NA 141 03/09/2007 2256   K 4.1 03/09/2007 2256   CL 105 03/09/2007 2256   CO2 28 03/09/2007 2256   GLUCOSE 107 (H) 03/09/2007 2256   BUN 7 03/09/2007 2256   CREATININE 0.94 03/09/2007 2256   CALCIUM 9.5 03/09/2007 2256   GFRNONAA >60 03/09/2007 2256   GFRAA  03/09/2007 2256    >60        The eGFR has been calculated using the MDRD equation. This calculation has not been validated in all clinical       Component Value Date/Time   WBC 6.1 01/04/2020 0853   WBC 4.7 03/09/2007 2256   RBC 5.28 01/04/2020 0853   RBC 5.06 03/09/2007 2256   HGB 15.7 01/04/2020 0853   HCT 47.0 01/04/2020 0853  PLT 171 01/04/2020 0853   MCV 89 01/04/2020 0853   MCH 29.7 01/04/2020 0853   MCHC 33.4 01/04/2020 0853   MCHC 35.3 03/09/2007 2256   RDW 13.4 01/04/2020 0853   LYMPHSABS 1.7 01/04/2020 0853   MONOABS 0.4 03/09/2007 2256   EOSABS 0.0 01/04/2020 0853   BASOSABS 0.1 01/04/2020 0853    No results found for: "POCLITH", "LITHIUM"   Lab Results  Component Value  Date   VALPROATE 78.4 03/15/2007    09/25/21 lithium level 0.8   .res Assessment: Plan:    Schizoaffective disorder, bipolar type (HCC) - Plan: cloZAPine (CLOZARIL) 100 MG tablet, lithium carbonate (ESKALITH) 450 MG ER tablet  Social anxiety disorder  Mixed obsessional thoughts and acts - Plan: FLUoxetine (PROZAC) 20 MG capsule  Generalized anxiety disorder  Obstructive sleep apnea  Mild cognitive impairment  Long term current use of clozapine  Lithium use  Myoclonic jerking   Disc smoking cessation.  He's stopped completely with Chantix for 2 mos.    His schizoaffective disorder symptoms including paranoia and irritability are well controlled with the current medications.  Including the clozapine.  His CBC with differential has been stable and unremarkable.  These have been recorded in the paper chart because it is difficult to send the results to the pharmacy and our [facility] through epic we discussed the side effects of clozapine in detail.    We have tried to mitigate excessive tiredness from clozapine that through the use of modafinil or Nuvigil with partial success.Consider switch to [facility]. Helped to increase modafinil 400 mg daily.  He's using CPAP regularly. Dr. Craige Cotta checked it out.  For SOB see PCP.  He has mild cognitive impairment is better with less clozapine and modafinil  Sertraline helped OCD some but sexual SE.  Concerns about DDI with clozapine from fluvoxamine and fluoxetine which both have probably less sex SE.  Least risk probably with fluoxetine so will switch to it.  Disc Ddi and risk sedation. Continue fluoxetine 40 mg daily  He was having myoclonic jerks in legs and arms likely from clozapine but better and no falls..   Myoclonic jerks are better nearly gone and he wants trial of higher dose.  OK with DC lamotrigine   ANC levels are within normal limits.  Options for labs [facility] or [facility]  TSH and lithium level DT Fatigue.  Done and is OK normal B12  and folate DT worsening cognition June 2022 He and this office are having difficulties dealing with his current lab which is an independent laboratory that does not send results in epic. Continue lithium 450 BID  Consider memantine for cognition if not better.  Disc DDI clozapine and smoking.  Evidence suggests 40% lower levels in smokers which may require reducing the clozapine as he quits smoking.  He has reduced from 700 to 500 mg in the past. Disc Chantix and smoking cessation.   If stays off smoking then may be able to reduce clozapine again.  He feels a little more drowsy  Has done well with reduction clozapine to 300 mg daily DT fatigue and cognive complaints.    Disc relapse risk.  His paranoia had worsened at 400 mg daily previously but DC smoking is probably helping [facility] DDI.  We discussed the risk of worsening paranoia if we reduce the dosage.   He agrees No problems with this. Cannot go lower.  Follow-up 3 mos  Meredith Staggers, MD, DFAPA   No future appointments.         -------------------------------

## 2022-06-07 ENCOUNTER — Other Ambulatory Visit: Payer: Self-pay | Admitting: Psychiatry

## 2022-06-07 DIAGNOSIS — F4010 Social phobia, unspecified: Secondary | ICD-10-CM

## 2022-06-15 ENCOUNTER — Encounter: Payer: Self-pay | Admitting: Psychiatry

## 2022-06-15 DIAGNOSIS — Z79899 Other long term (current) drug therapy: Secondary | ICD-10-CM | POA: Diagnosis not present

## 2022-07-27 DIAGNOSIS — Z79899 Other long term (current) drug therapy: Secondary | ICD-10-CM | POA: Diagnosis not present

## 2022-08-10 ENCOUNTER — Other Ambulatory Visit: Payer: Self-pay | Admitting: Psychiatry

## 2022-08-10 ENCOUNTER — Encounter: Payer: Self-pay | Admitting: Psychiatry

## 2022-08-10 DIAGNOSIS — G4733 Obstructive sleep apnea (adult) (pediatric): Secondary | ICD-10-CM

## 2022-08-13 DIAGNOSIS — Z79899 Other long term (current) drug therapy: Secondary | ICD-10-CM | POA: Diagnosis not present

## 2022-08-15 ENCOUNTER — Telehealth: Payer: Self-pay | Admitting: Psychiatry

## 2022-08-15 NOTE — Telephone Encounter (Signed)
Let patient know that labs had been put in [facility]. He said he got labs again on Monday, 7/29. Do not have those results yet.

## 2022-08-15 NOTE — Telephone Encounter (Signed)
Pt called and wanted to make sure that his labs were sent to [facility]

## 2022-08-16 ENCOUNTER — Encounter: Payer: Self-pay | Admitting: Psychiatry

## 2022-08-23 ENCOUNTER — Other Ambulatory Visit: Payer: Self-pay | Admitting: Psychiatry

## 2022-08-23 DIAGNOSIS — G4733 Obstructive sleep apnea (adult) (pediatric): Secondary | ICD-10-CM

## 2022-08-26 ENCOUNTER — Other Ambulatory Visit: Payer: Self-pay | Admitting: Psychiatry

## 2022-08-26 DIAGNOSIS — F250 Schizoaffective disorder, bipolar type: Secondary | ICD-10-CM

## 2022-09-01 ENCOUNTER — Other Ambulatory Visit: Payer: Self-pay | Admitting: Psychiatry

## 2022-09-01 DIAGNOSIS — F422 Mixed obsessional thoughts and acts: Secondary | ICD-10-CM

## 2022-09-05 ENCOUNTER — Encounter: Payer: Self-pay | Admitting: Psychiatry

## 2022-09-06 ENCOUNTER — Ambulatory Visit: Payer: Medicare Other | Admitting: Psychiatry

## 2022-09-12 ENCOUNTER — Encounter: Payer: Self-pay | Admitting: Psychiatry

## 2022-09-12 ENCOUNTER — Ambulatory Visit (INDEPENDENT_AMBULATORY_CARE_PROVIDER_SITE_OTHER): Payer: Medicare Other | Admitting: Psychiatry

## 2022-09-12 DIAGNOSIS — F250 Schizoaffective disorder, bipolar type: Secondary | ICD-10-CM | POA: Diagnosis not present

## 2022-09-12 DIAGNOSIS — Z79899 Other long term (current) drug therapy: Secondary | ICD-10-CM

## 2022-09-12 NOTE — Progress Notes (Signed)
Matthew Reed [id number] 1975/05/04 47 y.o.    Subjective:   Patient ID:  Matthew Reed is a 47 y.o. (DOB 09-19-1975) male.  Chief Complaint:  Chief Complaint  Patient presents with   Follow-up    Psych dxes     HPI  Matthew Reed presents to the office today for follow-up of smoking and schizoaffective disorder, MCI and Se meds.  seen August 2020.  He wanted to have a trial of Wellbutrin for smoking cessation that was initiated 150 mg in the morning to then increase to 300 mg, the usual dose.  Patient called October 5 with the following complaint:Pt is having pretty bad side effects he thinks its from the Lithium he is on. Pt's leg start twitching then he falls down. He said its been going on for awhile, but has got worse.  He was given the following response from our office: Patient's complaints are most consistent with myoclonic jerks.  These can occur with lithium.  They can also occur with clozapine.  They are more common when clozapine dosages are above 600 mg daily and the patient is taking 700 mg daily.  Serum lithium level was 1.0.  Serum BMP is within normal limits including creatinine 1.26 calcium 9.1.  TSH is also normal at 3.1  His lithium level is not high.  Usually lithium related jerks do not lead to falls.  It is more likely this is related to clozapine.  The treatment for clozapine related jerks is lamotrigine.  The last note from the patient states that he has already reduced the lithium by half tablet daily.  If he has done that for over a week then the lithium is not the cause and we need to start the lamotrigine.  We cannot reduce the lithium further.  It typically takes about 1 week for reduction in lithium dosage to lead to reduction in side effects.  If it has been 1 full week then it is necessary to start the low-dose lamotrigine as noted below.  If it has been less than 1 week he can wait until it has been 1 week and then notify us.  Because it is a Friday I  will go ahead and send in the prescription for lamotrigine and he can choose to start it now or defer until he has been on the lower dose of lithium for 1 full week.  Instructions on lamotrigine dosing will be on the prescription.   TC  Reduced lithium to 2 daily about 5 days and the jerks are improved.  Larey Seat a couple of times.  Better now.    Has reduced the clozapine from 700 to 600 mg daily.  He's reduced his smoking by 1/2 or more.  This will tend to increase the blood clozapine.  If the sx continue mid next week then call and may require checking blood level of clozapine or adding lamotrigine.  Wellbutrin has helped reduce the smoking.  There are several issues that could be going on here.  Both lithium and clozapine can clear trigger myoclonic jerks.  He is already reduced the lithium.  He is also reduced to clozapine but he is also reduce smoking which will tend to increase the clozapine blood level.  Clozapine levels above 600 mg a day can trigger myoclonic jerks and potentially dose-related seizures.  Often lamotrigine is used to counteract this problem.  However there have been so many changes lately that it seems prudent to wait until next week  once the lithium levels are more stable in order to better evaluate the situation.  If the jerks continue then we will probably check a blood level of clozapine and also add lamotrigine.  If jerks continue then call next week.  He agrees to this plan.  His lithium level in October before he reduced lithium was 1.0 Since that appointment he has reduced both lithium and clozapine because of jerks.   seen December 04, 2018 and the following was discussed. He just increased the lamotrigine to 100 early November and hasn't seen changes with the jerks.  They are scary bc have caused falls 3-4 times since here but last time was bc tripped.  Legs and arms will jerk.  Less tremor lately with hands.  Legs will get week and shake and not feel strong enough to  carry him.  If he tries to lift something over 20# it will happen.  No unusual back problems.  Hx sciatica resolved. Reduced smoking from 40 to 10 daily with Wellbutrin 150 helping.    OK trial Wellbutrin XL  300 mg AM retrial for smoking cessation DT failure of Chantix and nicotine patches.  Disc SE in detail.  Discussed the risk that this could increasing's anxiety or cause paranoia or mania.  Because of the severity of risk of smoking to his overall health it is reasonable to try this. Disc DDI clozapine and smoking.  Evidence suggests 40% lower levels in smokers which may require reducing the clozapine as he quits smoking.  He has reduced from 700 to 500 mg in the last week.   OK reduce clozapine to 400 mg daily.  Disc relapse risk.  He called December 1 reporting the leg jerks were better but not gone and wanted an increase to low lamotrigine to try to help with this problem.  It was increased from 100 to 150 mg daily at that time.  seen January 20, 2019. No problems are worse with reduction in clozapine to 400 mg and sleepiness is better.  Still tends to worry over things too much.  Paranoia still controlled. No mood swings.   Cigarettes 10-12 daily.  Wellbutrin helped him cut back.  Wants to stop. Jerks are 80-90% better and mainly when doing heavy lifting. He wanted to increase the Wellbutrin XL to 450 mg daily to see if he could completely stop smoking and that dose change was made.  03/18/2019 appointment the following is noted: Started having more paranoia and increased clozapine to 500 mg and the paranoia resolved. Forgot to increase Wellbutrin and still smoking 7-10 days.  Working PT and it's good.  Just a few hours weekly.  Gets fatigued if does it too much. Jerks are 80% better and asks about increasing lamotrigine from 225 mg AM. Still preaches and tolerated critique.  No paranoia.  Mood stable.  No mania.  Cut hours PT at work bc too stressed and that helped.  Patient reports stable  mood and denies depressed or irritable moods.   Patient denies difficulty with sleep initiation or maintenance. Sleep 11-1028. Using BiPap. Denies appetite disturbance.  Patient reports that energy and motivation have been good.  Patient denies any difficulty with concentration.  Patient denies any suicidal ideation. Asks about increasing Wellbutrin to the max to try stop smoking completely.  He is aware of the risk of it causing more anxiety and paranoia.   Plan: Increase Wellbutrin XL to 450 mg daily to maximize benefit for smoking cessation. Increase lamotrigine to 2  daily to help with jerks.  06/17/2019 appointment the following is noted: Took Wellbutrin 450 mg for awhile and was cranky, moody and paranoid and stopped and it resolved off of it. It did help with smoking.  Increased smoking caused increased paranoia and needed to increase clozapine to 600 mg daily from 500 mg daily. Some PT work stressed dealing with the public but usually can handle it if not around people so much. Sleep OK with bipap with good reading.  Machine is 48 years old. Still occ speaks at church. Jerking is a lot better but it still happens.  No falls. Propranolol helps anxiety and tremor. Plan: For the jerks increase lamotrigine to 2 tablets in the morning. Increase Wellbutrin to 3 of the 150 mg tablets each morning. He forgot to do this last visit.  10/21/19 appt noted: Couldn't tolerate Wellbutrin DT moodiness and stopped. CO OCD anxiety compulsively checks for keys and schedule bc fear of losing or forgetting things.  Hides his checking from others.  Occ may have to recheck but obsesses on it.  Wife doesn't notice his OCD.   Anxiety is not too bad otherwise.  Not paranoid.  Not depressed.   Patient reports stable mood and denies depressed or irritable moods.  Patient denies any recent difficulty with anxiety.  Patient denies difficulty with sleep initiation or maintenance. Denies appetite disturbance.  Patient  reports that energy and motivation have been good.  Patient denies any difficulty with concentration.  Patient denies any suicidal ideation. Grinding teeth he thinks at night.   No problems with other meds.   Less leg twitches now.   Gets tired about 2-3 in afternoon. Plan: Consider afternoon modafinil. Yes, 200 mg in AM and 100 mg in PM Wellbutrin made him moody and he had to stop it.  02/22/2020 appointment with following noted: OCD is getting worse.  Repeated checking rituals around eating, not sure about anything.  So frustrating.  Checks lights when driving.   No reason for it to be worse unless stress but doesn't feel more stressed out. Fatigue and wants thyroid checked.  No mood swings.  No mania.  Plan start sertraline up to 100 mg daily for OCD type sx.  04/21/2020 appt noted: Sertraline 100 worked a little for OCD but affecting sex function and concerned about it. Everything else is great. Not having to check things as much but still does.  Still worries.  Does he have keys or schedule.  Doubts about what he is doing.   Taking energy shot drinks bc feels tired. Sleep doc pleased with bipap with good response. Not jittery with energy drinks and doesn't affect sleep. Increase modafinil to 300 mg daily. Plan: Sertraline helped OCD some but sexual SE.  Concerns about DDI with clozapine from fluvoxamine and fluoxetine which both have probably less sex SE.  Least risk probably with fluoxetine so will switch to it.  Disc Ddi and risk sedation. Switch sertraline to fluoxetine 40 mg daily  Reduce sertraline to 1/2 tablet and start fluoxetine 1of the 20 mg capsule daily for 1 week. Then increase fluoxetine to 2 capsules daily and stop sertraline.   05/17/2020 phone call: Patient complaining that clozapine was making him sick excessively sleepy. MD response: He is currently taking clozapine 600 mg in the evening. He reduced the clozapine dose in 2017 and gradually had worsening anxiety and  paranoia at 500 mg daily.  However if he is willing to take that risk he can reduce the clozapine  to 500 mg at night.  06/22/2020 appointment with the following noted: Still OCD sx not any better and no different from when on sertraline.  Checks things repeatedly, schedule locks.   Also still drowsy after reducing the clozapine to 500 mg at night. Seen with wife who said there were couple of instances of paranoia at work.  He mentioned to her a week ago.  She says day to day he seems ok.   Marylene Land concerned about his memory.  He can't remember things and it wears her out.  She has to repeat instructions to him repeatedly.  Can't depend on him to keep up with stuff.   Pulm saw him and his AHI was 0.3. She sees general cognitive decline. Plan: Reduce lamotrigine to 1-1/2 tablets in the morning to see if it helps memory increase modafinil to 2 tablets daily Check vitamin B12 and folate level they were normal.  11/16/2020 appointment with the following noted: Will be watching new grandchild in December. Doing good.  Work PT [employer]. Feel like he's not getting enough oxygen.   Sleep variable.  Paranoia under control. Sleep good and using CPAP.  05/17/21 appt noted: CC tired and forgetful. On clozapine to 600 mg daily.  Asks to reduce. Otherwise good mood and anxiety.   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   Plan: OK trial with reduction clozapine to 500 mg daily DT fatigue and cognive complaints.     08/17/21 appt noted: Pt called early July asking to reduce clozapine again to 400 mg .  Disc risk relapse but he wants to pursue anyway so it was agreed. Not as groggy and tired.  Mind can go blank.  Sometimes a  little more overwhelmed but brief.  No paranoia or irritability. M in Law  with [place] and wife there everyday.   PT work very low stress.  No problems dealing with people. Quit smoking and thinks that enabled him to reduce clozapine. Chantix helped him and off for 4 weeks. Other meds: no lamotrigine, fluoxetine  40, modafinil 200 BID, lihtium 450 BID, propranolol 20 BID and 40 PM. Sleep is pretty good and less groggy on awakening.  At least 8-9 hours.  No sig napping. No falls in a long time until yesterday.  No sig jerks. Plan: OK trial with reduction clozapine to 400 mg daily DT fatigue and cognive complaints.    12/06/21 appt noted: M in law stage 4 cancer and wife helps care for her. Reduced clozapine 400 mg HS and a little harder to go to sleep and less tired in the day.    To bed 11 and up by 9 AM. No anxiety, anger, irritability or anger being worse. Nothing worse with less clozapine. Doing low stress PT job.   Down to 3-4 cig per day with benefit of Chantix.  If misses Chantix then strong craving. Less cough and SOB. Drowsiness is not that bad in the day usually.  Drinking less coffee.   Propranolol helps anxiety and tremor. Uses [employer] Medicare D plan and works well. Focus better in speaking  03/08/22 appt noted: Quit smoking for 2 weeks.  Wife quitting too with patches. Doing good overall.  No paranoia or mood swings.   No problems with meds other than sleepiness.   Still active at church.  Has lost some leaders in church.  His father comes to church some.    04/05/22 TC with staff:  Patient c/o being too drowsy with 400 mg clozapine. He is asking to  cut down to 300 mg. He said he had not smoked in 2 months. He said if he started having sx he would increase it. He currently has RF for 600 mg.     MD resp:  He can reduce clozapine 100 mg tablets from 4 nightly to 3 nightly.  It must be reduced slowly otherwise paranoia will recur.     06/06/22 appt noted: Meds: clozaine 300 mg HS,  No smoking for over 2 mos.  Chantix helps.   If misses more craving cig.   Wife also quit smoking.  Saving money No problems with reduction in clozapine to 300 mg HS. Less sleepiness and tiredness.  No mood swings.  No sig paranoia. Anxiety manageable.   No complaints. Sleep about 11-9.   No depression.   Family doing good.  Parents still living.   Plan: no changes  09/12/22 appt noted: Psych meds: Clozapine 300 nightly, fluoxetine 40 mg daily, lithium ER 450 twice daily, modafinil 200 mg twice daily, propranolol 20 mg 2 to 3 tablets twice daily as needed for tremor or anxiety, stopped Chantix 1 mg twice daily No smoking for months. Doing pretty good.  Enjoys PT work.  Managed difficult coworker.  GD on the way.   Mood good.  No mood swings.  No complaints.   Sleep about 11-9 .  Benefit with meds.   No SE concerns.  Less sedation with less clozapine.  Kids: 22, 17, 43, 54 GSON sees weekly.  They live in [place].  He reduced the clozapine dose and 2017 and gradually had worsening anxiety and paranoia at 500 mg daily.  Past Psychiatric Medication Trials:  On clozapine 700 since July 2018 to August 2020.  Chantix N and irritable and tired.  Tried third time and doing better with it 2024.  Wellbutrin 300 Haldol dystonia  olanzapine for many years, ziprasidone, risperidone,  benzodiazepine dependence remotely, modafinil,  propranolol,  fluvoxamine,  Sertraline 100 Aricept no response, Exelon partial response, Nuvigil, Provigil Remote history of abuse of CBZ  Review of Systems:  Review of Systems  Constitutional:  Negative for fatigue.  Respiratory:  Positive for shortness of breath. Negative for cough.   Cardiovascular:  Negative for palpitations.  Genitourinary:        ED and ejac delay  Neurological:  Negative for tremors.       Myoclonic jerks not evident in the office but reported.  Psychiatric/Behavioral:  Positive for decreased concentration. Negative for agitation, behavioral problems, confusion, dysphoric mood, hallucinations, self-injury, sleep disturbance and suicidal ideas. The patient is not nervous/anxious and is not hyperactive.   Not usually short of breath.  Medications: I have reviewed the patient's current medications.  Current Outpatient Medications  Medication  Sig Dispense Refill   cloZAPine (CLOZARIL) 100 MG tablet Take 4 tablets (400 mg total) by mouth at bedtime. (Patient taking differently: Take 300 mg by mouth at bedtime.) 120 tablet 5   FLUoxetine (PROZAC) 20 MG capsule TAKE 2 CAPSULES(40 MG) BY MOUTH DAILY 60 capsule 0   lithium carbonate (ESKALITH) 450 MG ER tablet TAKE 1 TABLET(450 MG) BY MOUTH TWICE DAILY 60 tablet 0   modafinil (PROVIGIL) 200 MG tablet TAKE 1 TABLET BY MOUTH IN THE MORNING AND AT 2 PM 180 tablet 1   propranolol (INDERAL) 20 MG tablet TAKE 2 TO 3 TABLETS BY MOUTH TWICE DAILY FOR TREMORS OR ANXIETY 540 tablet 1   varenicline (CHANTIX) 1 MG tablet TAKE 1 TABLET BY MOUTH TWICE DAILY (Patient not taking:  Reported on 09/12/2022) 180 tablet 0   No current facility-administered medications for this visit.    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  Tobacco Use   Smoking status: Former    Current packs/day: 0.00    Average packs/day: 1 pack/day for 31.4 years (31.4 ttl pk-yrs)    Types: Cigarettes    Start date: 03/12/1990    Quit date: 07/25/2021    Years since quitting: 1.1   Smokeless tobacco: Never   Tobacco comments:    Quit smoking 07/2021  Vaping Use   Vaping status: Never Used  Substance and Sexual Activity   Alcohol use: No   Drug use: No   Sexual activity: Not on file  Other Topics Concern   Not on file  Social History Narrative   Not on file   Social  Determinants of Health   Financial Resource Strain: Not on file  Food Insecurity: Not on file  Transportation Needs: Not on file  Physical Activity: Not on file  Stress: Not on file  Social Connections: Not on file  Intimate Partner Violence: Not on file    Past Medical History, Surgical history, Social history, and Family history were reviewed and updated as appropriate.   Please see review of systems for further details on the patient's review from today.     Objective:   Physical Exam:  There were no vitals taken for this visit.  Physical Exam Constitutional:      General: He is not in acute distress.    Appearance: He is well-developed.  Musculoskeletal:        General: No deformity.  Neurological:     Mental Status: He is alert and oriented to person, place, and time.     Motor: No tremor.     Coordination: Coordination normal.     Gait: Gait normal.  Psychiatric:        Attention and Perception: Attention and perception normal. He is attentive.        Mood and Affect: Mood is not anxious or depressed. Affect is not labile or angry.        Speech: Speech normal.        Behavior: Behavior normal.        Thought Content: Thought content normal. Thought content is not paranoid or delusional. Thought content does not include homicidal or suicidal ideation. Thought content does not include suicidal plan.        Cognition and Memory: Memory is not impaired. He does not exhibit impaired recent memory.        Judgment: Judgment normal.     Comments: Insight is good. Anxiety is better No paranoia.     Lab Review:     Component Value Date/Time   NA 141 03/09/2007 2256   K 4.1 03/09/2007 2256   CL 105 03/09/2007  2256   CO2 28 03/09/2007 2256   GLUCOSE 107 (H) 03/09/2007 2256   BUN 7 03/09/2007 2256   CREATININE 0.94 03/09/2007 2256   CALCIUM 9.5 03/09/2007 2256   GFRNONAA >60 03/09/2007 2256   GFRAA  03/09/2007 2256    >60        The eGFR has been  calculated using the MDRD equation. This calculation has not been validated in all clinical       Component Value Date/Time   WBC 6.1 01/04/2020 0853   WBC 4.7 03/09/2007 2256   RBC 5.28 01/04/2020 0853   RBC 5.06 03/09/2007 2256   HGB 15.7 01/04/2020 0853   HCT 47.0 01/04/2020 0853   PLT 171 01/04/2020 0853   MCV 89 01/04/2020 0853   MCH 29.7 01/04/2020 0853   MCHC 33.4 01/04/2020 0853   MCHC 35.3 03/09/2007 2256   RDW 13.4 01/04/2020 0853   LYMPHSABS 1.7 01/04/2020 0853   MONOABS 0.4 03/09/2007 2256   EOSABS 0.0 01/04/2020 0853   BASOSABS 0.1 01/04/2020 0853    No results found for: "POCLITH", "LITHIUM"   Lab Results  Component Value Date   VALPROATE 78.4 03/15/2007    09/25/21 lithium level 0.8     .res Assessment: Plan:    Schizoaffective disorder, bipolar type (HCC) - Plan: Lithium level, Comprehensive metabolic panel, TSH  Lithium use - Plan: Lithium level, Comprehensive metabolic panel, TSH  Encounter for long-term (current) use of medications - Plan: Comprehensive metabolic panel   Disc smoking cessation.  He's stopped completely with Chantix for 2 mos.    His schizoaffective disorder symptoms including paranoia and irritability are well controlled with the current medications.  Including the clozapine.  His CBC with differential has been stable and unremarkable.  These have been recorded in the paper chart because it is difficult to send the results to the pharmacy and our REMS through epic we discussed the side effects of clozapine in detail.    We have tried to mitigate excessive tiredness from clozapine that through the use of modafinil or Nuvigil with partial success.Consider switch to [facility]. Helped to increase modafinil 400 mg daily.  He's using CPAP regularly. Dr. Craige Cotta checked it out.  For SOB see PCP.  He has mild cognitive impairment is better with less clozapine and modafinil  Sertraline helped OCD some but sexual SE.  Concerns about DDI with  clozapine from fluvoxamine and fluoxetine which both have probably less sex SE.  Least risk probably with fluoxetine so will switch to it.  Disc Ddi and risk sedation. Continue fluoxetine 40 mg daily  He was having myoclonic jerks in legs and arms likely from clozapine but better and no falls..   Myoclonic jerks are better nearly gone and he wants trial of higher dose.  OK with DC lamotrigine   ANC levels are within normal limits.  Options for labs [facility] or [facility]  TSH and lithium level DT Fatigue.  Done and is OK normal B12 and folate DT worsening cognition June 2022 He and this office are having difficulties dealing with his current lab which is an independent laboratory that does not send results in epic. Continue lithium 450 BID  Consider memantine for cognition if not better.  Disc DDI clozapine and smoking.  Evidence suggests 40% lower levels in smokers which may require reducing the clozapine as he quits smoking.  He has reduced from 700 to 500 mg in the past. Disc Chantix and smoking cessation.   If stays  off smoking then may be able to reduce clozapine again.  He feels a little more drowsy  Has done well with reduction clozapine to 300 mg daily DT fatigue and cognive complaints.    Disc relapse risk.  His paranoia had worsened at 400 mg daily previously but DC smoking is probably helping [facility] DDI.  We discussed the risk of worsening paranoia if we reduce the dosage.   He agrees No problems with this. Cannot go lower.  No med changes:   Follow-up 4 mos  Meredith Staggers, MD, DFAPA   No future appointments.         -------------------------------

## 2022-09-18 DIAGNOSIS — Z79899 Other long term (current) drug therapy: Secondary | ICD-10-CM | POA: Diagnosis not present

## 2022-09-19 ENCOUNTER — Telehealth: Payer: Self-pay | Admitting: Pulmonary Disease

## 2022-09-19 NOTE — Telephone Encounter (Signed)
Patient needs a up to date prescription for his bipap machine supplies. Appointment has been made.

## 2022-09-24 NOTE — Telephone Encounter (Signed)
Updated order will be placed at appointment.

## 2022-09-25 ENCOUNTER — Encounter: Payer: Self-pay | Admitting: Psychiatry

## 2022-10-24 ENCOUNTER — Telehealth: Payer: Medicare Other | Admitting: Primary Care

## 2022-10-25 DIAGNOSIS — Z79899 Other long term (current) drug therapy: Secondary | ICD-10-CM | POA: Diagnosis not present

## 2022-10-31 ENCOUNTER — Telehealth: Payer: Self-pay | Admitting: Psychiatry

## 2022-10-31 ENCOUNTER — Encounter: Payer: Self-pay | Admitting: Psychiatry

## 2022-10-31 NOTE — Telephone Encounter (Signed)
Pt called at 1:31p asking that his blood work get put in the system for the Clozapine. The pharmacy gave him the medicine this one time, but they need the info put in RIMS?  Next appt 12/30

## 2022-10-31 NOTE — Telephone Encounter (Signed)
Patient informed that labs received and put in REMS. Explained to him we have not been getting the results and we have to keep calling. He said he will ask the lab to send to [place], but would also let us know when he had them drawn so we could call if not received.

## 2022-11-01 ENCOUNTER — Telehealth: Payer: Medicare Other | Admitting: Primary Care

## 2022-11-01 ENCOUNTER — Other Ambulatory Visit: Payer: Self-pay | Admitting: Psychiatry

## 2022-11-01 DIAGNOSIS — G4733 Obstructive sleep apnea (adult) (pediatric): Secondary | ICD-10-CM

## 2022-11-01 DIAGNOSIS — F422 Mixed obsessional thoughts and acts: Secondary | ICD-10-CM

## 2022-11-01 NOTE — Telephone Encounter (Signed)
LF 09/05; LV 08/28; NV 12/30. I changed the qty to 148 to get him to his appt. Is that appropriate? Will the pharmacy fill that?

## 2022-11-01 NOTE — Progress Notes (Signed)
Virtual Visit via Video Note  I connected with Matthew Reed on 11/01/22 at  9:30 AM EDT by a video enabled telemedicine application and verified that I am speaking with the correct person using two identifiers.  Location: Patient: Home Provider: Office    I discussed the limitations of evaluation and management by telemedicine and the availability of in person appointments. The patient expressed understanding and agreed to proceed.  History of Present Illness: 47 year old male former  smoker  followed for obstructive sleep apnea Medical history significant for schizoaffective disorder  Previous [facility]:  08/25/2021 Follow up : OSA Patient presents for a 1 year follow-up. Patient has OSA on nocturnal BIPAP.  Patient is on BiPAP wears his BiPAP every single night usually for 8-9 hrs . Feels he benefits from BiPAP with improved sleep and decreased daytime sleepiness.  Patient is followed by psychiatry for schizoaffective disorder and is on modafinil for hypersomnia.  He feels that this does help ,.  BiPAP download shows excellent compliance with 100% usage.  Daily average usage at 9 hours.  Patient is on auto BiPAP IPAP max 14 EPAP minimum at 10 cm H2O.  AHI 2.2/hour Uses full face mask.   Was able to quit smoking last month with Chantix .    11/01/2022- Interim hx  Patient contacted today for OSA follow-up.  He remains compliant with BiPAP.  Reports significant benefit from wearing BiPAP at night.  He quit smoking 6 to 8 months ago.  No longer taking Chantix.  Needs BIPAP supplies refilled.  Observations/Objective:  - Appears well; No overt shortness of breath or respiratory distress  TEST/EVENTS :  PSG 2010 >> AHI 49  Assessment and Plan:  Severe OSA: - Patient is 100% compliant with BiPAP use; average usage 8 hours 59 minutes - Pressure 14/10 cm H2O; AHI 2.2. Air leaks 66.5 L/min - Refilling BIPAP supplies   Follow Up Instructions:  1 year FU    I discussed the  assessment and treatment plan with the patient. The patient was provided an opportunity to ask questions and all were answered. The patient agreed with the plan and demonstrated an understanding of the instructions.   The patient was advised to call back or seek an in-person evaluation if the symptoms worsen or if the condition fails to improve as anticipated.  I provided 22 minutes of non-face-to-face time during this encounter.   Glenford Bayley, NP

## 2022-11-02 NOTE — Telephone Encounter (Signed)
He's been reliable about appts.  Pharmacies will usually fill up to #90 days on antidepressants.  So there's no reason not to go ahead and give 90 days worth which would be 180.  I will change it to that

## 2022-11-09 ENCOUNTER — Telehealth: Payer: Self-pay | Admitting: Primary Care

## 2022-11-09 NOTE — Telephone Encounter (Signed)
Pt called Please renew CPAP supplies. DME [place], [place]. And they do not have this order sent in on the 19th. Please call PT to advise what to do next @ [phone]

## 2022-11-14 NOTE — Telephone Encounter (Signed)
NFN 

## 2022-12-04 DIAGNOSIS — Z79899 Other long term (current) drug therapy: Secondary | ICD-10-CM | POA: Diagnosis not present

## 2022-12-11 ENCOUNTER — Other Ambulatory Visit: Payer: Self-pay | Admitting: Psychiatry

## 2022-12-11 DIAGNOSIS — F4010 Social phobia, unspecified: Secondary | ICD-10-CM

## 2022-12-20 ENCOUNTER — Other Ambulatory Visit: Payer: Self-pay | Admitting: Psychiatry

## 2022-12-20 ENCOUNTER — Telehealth: Payer: Self-pay | Admitting: Psychiatry

## 2022-12-20 DIAGNOSIS — F250 Schizoaffective disorder, bipolar type: Secondary | ICD-10-CM

## 2022-12-20 MED ORDER — CLOZAPINE 100 MG PO TABS
400.0000 mg | ORAL_TABLET | Freq: Every day | ORAL | 1 refills | Status: DC
Start: 2022-12-20 — End: 2023-02-15

## 2022-12-20 NOTE — Telephone Encounter (Signed)
Patient requesting RF of clozapine.  We have had difficulty for several months getting lab results. After 2 phone calls I did get results from 11/19 yesterday and gave to Traci.   WG in [place]

## 2022-12-20 NOTE — Telephone Encounter (Signed)
Pt called at 11:38a stating he had bloodwork done about 2 wks ago at [facility] for him to get Clozapine.  I don't see the results in the lab tab.  Pls send script to   [id number] [place], [place] - [address] AT [facility] [address], [place] [address] Phone: [phone]  Fax: [phone]   Next appt 12/30

## 2023-01-02 ENCOUNTER — Encounter: Payer: Self-pay | Admitting: Psychiatry

## 2023-01-04 ENCOUNTER — Encounter: Payer: Self-pay | Admitting: Psychiatry

## 2023-01-04 DIAGNOSIS — Z79899 Other long term (current) drug therapy: Secondary | ICD-10-CM | POA: Diagnosis not present

## 2023-01-14 ENCOUNTER — Ambulatory Visit (INDEPENDENT_AMBULATORY_CARE_PROVIDER_SITE_OTHER): Payer: Medicare Other | Admitting: Psychiatry

## 2023-01-14 ENCOUNTER — Other Ambulatory Visit: Payer: Self-pay | Admitting: Psychiatry

## 2023-01-14 ENCOUNTER — Encounter: Payer: Self-pay | Admitting: Psychiatry

## 2023-01-14 DIAGNOSIS — F4010 Social phobia, unspecified: Secondary | ICD-10-CM

## 2023-01-14 DIAGNOSIS — G4733 Obstructive sleep apnea (adult) (pediatric): Secondary | ICD-10-CM | POA: Diagnosis not present

## 2023-01-14 DIAGNOSIS — F422 Mixed obsessional thoughts and acts: Secondary | ICD-10-CM | POA: Diagnosis not present

## 2023-01-14 DIAGNOSIS — G3184 Mild cognitive impairment, so stated: Secondary | ICD-10-CM | POA: Diagnosis not present

## 2023-01-14 DIAGNOSIS — F411 Generalized anxiety disorder: Secondary | ICD-10-CM

## 2023-01-14 DIAGNOSIS — Z79899 Other long term (current) drug therapy: Secondary | ICD-10-CM | POA: Diagnosis not present

## 2023-01-14 DIAGNOSIS — F250 Schizoaffective disorder, bipolar type: Secondary | ICD-10-CM | POA: Diagnosis not present

## 2023-01-14 MED ORDER — PROPRANOLOL HCL 20 MG PO TABS: ORAL_TABLET | ORAL | 0 refills | Status: DC

## 2023-01-14 MED ORDER — MODAFINIL 200 MG PO TABS: ORAL_TABLET | ORAL | 1 refills | Status: DC

## 2023-01-14 MED ORDER — FLUOXETINE HCL 20 MG PO CAPS: ORAL_CAPSULE | ORAL | 1 refills | Status: DC

## 2023-01-14 NOTE — Progress Notes (Signed)
[name] [id number] 1975-07-08 47 y.o.    Subjective:   Patient ID:  Matthew Reed is a 47 y.o. (DOB [birth year]) male.  Chief Complaint:  Chief Complaint  Patient presents with   Follow-up     HPI  Matthew Reed presents to the office today for follow-up of smoking and schizoaffective disorder, MCI and Se meds.  seen August 2020.  He wanted to have a trial of Wellbutrin for smoking cessation that was initiated 150 mg in the morning to then increase to 300 mg, the usual dose.  Patient called October 5 with the following complaint:Pt is having pretty bad side effects he thinks its from the Lithium he is on. Pt's leg start twitching then he falls down. He said its been going on for awhile, but has got worse.  He was given the following response from our office: Patient's complaints are most consistent with myoclonic jerks.  These can occur with lithium.  They can also occur with clozapine.  They are more common when clozapine dosages are above 600 mg daily and the patient is taking 700 mg daily.  Serum lithium level was 1.0.  Serum BMP is within normal limits including creatinine 1.26 calcium 9.1.  TSH is also normal at 3.1  His lithium level is not high.  Usually lithium related jerks do not lead to falls.  It is more likely this is related to clozapine.  The treatment for clozapine related jerks is lamotrigine.  The last note from the patient states that he has already reduced the lithium by half tablet daily.  If he has done that for over a week then the lithium is not the cause and we need to start the lamotrigine.  We cannot reduce the lithium further.  It typically takes about 1 week for reduction in lithium dosage to lead to reduction in side effects.  If it has been 1 full week then it is necessary to start the low-dose lamotrigine as noted below.  If it has been less than 1 week he can wait until it has been 1 week and then notify us.  Because it is a Friday I will go ahead  and send in the prescription for lamotrigine and he can choose to start it now or defer until he has been on the lower dose of lithium for 1 full week.  Instructions on lamotrigine dosing will be on the prescription.   TC  Reduced lithium to 2 daily about 5 days and the jerks are improved.  Larey Seat a couple of times.  Better now.    Has reduced the clozapine from 700 to 600 mg daily.  He's reduced his smoking by 1/2 or more.  This will tend to increase the blood clozapine.  If the sx continue mid next week then call and may require checking blood level of clozapine or adding lamotrigine.  Wellbutrin has helped reduce the smoking.  There are several issues that could be going on here.  Both lithium and clozapine can clear trigger myoclonic jerks.  He is already reduced the lithium.  He is also reduced to clozapine but he is also reduce smoking which will tend to increase the clozapine blood level.  Clozapine levels above 600 mg a day can trigger myoclonic jerks and potentially dose-related seizures.  Often lamotrigine is used to counteract this problem.  However there have been so many changes lately that it seems prudent to wait until next week once the lithium levels are  more stable in order to better evaluate the situation.  If the jerks continue then we will probably check a blood level of clozapine and also add lamotrigine.  If jerks continue then call next week.  He agrees to this plan.  His lithium level in October before he reduced lithium was 1.0 Since that appointment he has reduced both lithium and clozapine because of jerks.   seen December 04, 2018 and the following was discussed. He just increased the lamotrigine to 100 early November and hasn't seen changes with the jerks.  They are scary bc have caused falls 3-4 times since here but last time was bc tripped.  Legs and arms will jerk.  Less tremor lately with hands.  Legs will get week and shake and not feel strong enough to carry him.  If  he tries to lift something over 20# it will happen.  No unusual back problems.  Hx sciatica resolved. Reduced smoking from 40 to 10 daily with Wellbutrin 150 helping.    OK trial Wellbutrin XL  300 mg AM retrial for smoking cessation DT failure of Chantix and nicotine patches.  Disc SE in detail.  Discussed the risk that this could increasing's anxiety or cause paranoia or mania.  Because of the severity of risk of smoking to his overall health it is reasonable to try this. Disc DDI clozapine and smoking.  Evidence suggests 40% lower levels in smokers which may require reducing the clozapine as he quits smoking.  He has reduced from 700 to 500 mg in the last week.   OK reduce clozapine to 400 mg daily.  Disc relapse risk.  He called December 1 reporting the leg jerks were better but not gone and wanted an increase to low lamotrigine to try to help with this problem.  It was increased from 100 to 150 mg daily at that time.  seen January 20, 2019. No problems are worse with reduction in clozapine to 400 mg and sleepiness is better.  Still tends to worry over things too much.  Paranoia still controlled. No mood swings.   Cigarettes 10-12 daily.  Wellbutrin helped him cut back.  Wants to stop. Jerks are 80-90% better and mainly when doing heavy lifting. He wanted to increase the Wellbutrin XL to 450 mg daily to see if he could completely stop smoking and that dose change was made.  03/18/2019 appointment the following is noted: Started having more paranoia and increased clozapine to 500 mg and the paranoia resolved. Forgot to increase Wellbutrin and still smoking 7-10 days.  Working PT and it's good.  Just a few hours weekly.  Gets fatigued if does it too much. Jerks are 80% better and asks about increasing lamotrigine from 225 mg AM. Still preaches and tolerated critique.  No paranoia.  Mood stable.  No mania.  Cut hours PT at work bc too stressed and that helped.  Patient reports stable mood and denies  depressed or irritable moods.   Patient denies difficulty with sleep initiation or maintenance. Sleep 11-1028. Using BiPap. Denies appetite disturbance.  Patient reports that energy and motivation have been good.  Patient denies any difficulty with concentration.  Patient denies any suicidal ideation. Asks about increasing Wellbutrin to the max to try stop smoking completely.  He is aware of the risk of it causing more anxiety and paranoia.   Plan: Increase Wellbutrin XL to 450 mg daily to maximize benefit for smoking cessation. Increase lamotrigine to 2 daily to help with jerks.  06/17/2019 appointment the following is noted: Took Wellbutrin 450 mg for awhile and was cranky, moody and paranoid and stopped and it resolved off of it. It did help with smoking.  Increased smoking caused increased paranoia and needed to increase clozapine to 600 mg daily from 500 mg daily. Some PT work stressed dealing with the public but usually can handle it if not around people so much. Sleep OK with bipap with good reading.  Machine is 48 years old. Still occ speaks at church. Jerking is a lot better but it still happens.  No falls. Propranolol helps anxiety and tremor. Plan: For the jerks increase lamotrigine to 2 tablets in the morning. Increase Wellbutrin to 3 of the 150 mg tablets each morning. He forgot to do this last visit.  10/21/19 appt noted: Couldn't tolerate Wellbutrin DT moodiness and stopped. CO OCD anxiety compulsively checks for keys and schedule bc fear of losing or forgetting things.  Hides his checking from others.  Occ may have to recheck but obsesses on it.  Wife doesn't notice his OCD.   Anxiety is not too bad otherwise.  Not paranoid.  Not depressed.   Patient reports stable mood and denies depressed or irritable moods.  Patient denies any recent difficulty with anxiety.  Patient denies difficulty with sleep initiation or maintenance. Denies appetite disturbance.  Patient reports that energy  and motivation have been good.  Patient denies any difficulty with concentration.  Patient denies any suicidal ideation. Grinding teeth he thinks at night.   No problems with other meds.   Less leg twitches now.   Gets tired about 2-3 in afternoon. Plan: Consider afternoon modafinil. Yes, 200 mg in AM and 100 mg in PM Wellbutrin made him moody and he had to stop it.  02/22/2020 appointment with following noted: OCD is getting worse.  Repeated checking rituals around eating, not sure about anything.  So frustrating.  Checks lights when driving.   No reason for it to be worse unless stress but doesn't feel more stressed out. Fatigue and wants thyroid checked.  No mood swings.  No mania.  Plan start sertraline up to 100 mg daily for OCD type sx.  04/21/2020 appt noted: Sertraline 100 worked a little for OCD but affecting sex function and concerned about it. Everything else is great. Not having to check things as much but still does.  Still worries.  Does he have keys or schedule.  Doubts about what he is doing.   Taking energy shot drinks bc feels tired. Sleep doc pleased with bipap with good response. Not jittery with energy drinks and doesn't affect sleep. Increase modafinil to 300 mg daily. Plan: Sertraline helped OCD some but sexual SE.  Concerns about DDI with clozapine from fluvoxamine and fluoxetine which both have probably less sex SE.  Least risk probably with fluoxetine so will switch to it.  Disc Ddi and risk sedation. Switch sertraline to fluoxetine 40 mg daily  Reduce sertraline to 1/2 tablet and start fluoxetine 1of the 20 mg capsule daily for 1 week. Then increase fluoxetine to 2 capsules daily and stop sertraline.   05/17/2020 phone call: Patient complaining that clozapine was making him sick excessively sleepy. MD response: He is currently taking clozapine 600 mg in the evening. He reduced the clozapine dose in 2017 and gradually had worsening anxiety and paranoia at 500 mg  daily.  However if he is willing to take that risk he can reduce the clozapine to 500 mg at night.  06/22/2020 appointment with the following noted: Still OCD sx not any better and no different from when on sertraline.  Checks things repeatedly, schedule locks.   Also still drowsy after reducing the clozapine to 500 mg at night. Seen with wife who said there were couple of instances of paranoia at work.  He mentioned to her a week ago.  She says day to day he seems ok.   Marylene Land concerned about his memory.  He can't remember things and it wears her out.  She has to repeat instructions to him repeatedly.  Can't depend on him to keep up with stuff.   Pulm saw him and his AHI was 0.3. She sees general cognitive decline. Plan: Reduce lamotrigine to 1-1/2 tablets in the morning to see if it helps memory increase modafinil to 2 tablets daily Check vitamin B12 and folate level they were normal.  11/16/2020 appointment with the following noted: Will be watching new grandchild in December. Doing good.  Work PT [employer]. Feel like he's not getting enough oxygen.   Sleep variable.  Paranoia under control. Sleep good and using CPAP.  05/17/21 appt noted: CC tired and forgetful. On clozapine to 600 mg daily.  Asks to reduce. Otherwise good mood and anxiety.   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   Plan: OK trial with reduction clozapine to 500 mg daily DT fatigue and cognive complaints.     08/17/21 appt noted: Pt called early July asking to reduce clozapine again to 400 mg .  Disc risk relapse but he wants to pursue anyway so it was agreed. Not as groggy and tired.  Mind can go blank.  Sometimes a  little more overwhelmed but brief.  No paranoia or irritability. M in Law  with [place] and wife there everyday.   PT work very low stress.  No problems dealing with people. Quit smoking and thinks that enabled him to reduce clozapine. Chantix helped him and off for 4 weeks. Other meds: no lamotrigine, fluoxetine 40, modafinil 200  BID, lihtium 450 BID, propranolol 20 BID and 40 PM. Sleep is pretty good and less groggy on awakening.  At least 8-9 hours.  No sig napping. No falls in a long time until yesterday.  No sig jerks. Plan: OK trial with reduction clozapine to 400 mg daily DT fatigue and cognive complaints.    12/06/21 appt noted: M in law stage 4 cancer and wife helps care for her. Reduced clozapine 400 mg HS and a little harder to go to sleep and less tired in the day.    To bed 11 and up by 9 AM. No anxiety, anger, irritability or anger being worse. Nothing worse with less clozapine. Doing low stress PT job.   Down to 3-4 cig per day with benefit of Chantix.  If misses Chantix then strong craving. Less cough and SOB. Drowsiness is not that bad in the day usually.  Drinking less coffee.   Propranolol helps anxiety and tremor. Uses [employer] Medicare D plan and works well. Focus better in speaking  03/08/22 appt noted: Quit smoking for 2 weeks.  Wife quitting too with patches. Doing good overall.  No paranoia or mood swings.   No problems with meds other than sleepiness.   Still active at church.  Has lost some leaders in church.  His father comes to church some.    04/05/22 TC with staff:  Patient c/o being too drowsy with 400 mg clozapine. He is asking to cut down to 300 mg. He  said he had not smoked in 2 months. He said if he started having sx he would increase it. He currently has RF for 600 mg.     MD resp:  He can reduce clozapine 100 mg tablets from 4 nightly to 3 nightly.  It must be reduced slowly otherwise paranoia will recur.     06/06/22 appt noted: Meds: clozaine 300 mg HS,  No smoking for over 2 mos.  Chantix helps.   If misses more craving cig.   Wife also quit smoking.  Saving money No problems with reduction in clozapine to 300 mg HS. Less sleepiness and tiredness.  No mood swings.  No sig paranoia. Anxiety manageable.   No complaints. Sleep about 11-9.   No depression.  Family doing good.   Parents still living.   Plan: no changes  09/12/22 appt noted: Psych meds: Clozapine 300 nightly, fluoxetine 40 mg daily, lithium ER 450 twice daily, modafinil 200 mg twice daily, propranolol 20 mg 2 to 3 tablets twice daily as needed for tremor or anxiety, stopped Chantix 1 mg twice daily No smoking for months. Doing pretty good.  Enjoys PT work.  Managed difficult coworker.  GD on the way.   Mood good.  No mood swings.  No complaints.   Sleep about 11-9 .  Benefit with meds.   No SE concerns.  Less sedation with less clozapine. Plan no changes  01/14/23 appt noted:  Psych meds as above  D near delivery of baby.  Will be GD.  She has son 46 mos old. Doing great on meds mentally.   Biggest problem is getting labs and results and clozapine.  May look into lab change bc that is the problem.    Kids: 83, 17, 50, 66 GSON sees weekly.  They live in [place].  He reduced the clozapine dose and 2017 and gradually had worsening anxiety and paranoia at 500 mg daily.  Past Psychiatric Medication Trials:  On clozapine 700 since July 2018 to August 2020.  Chantix N and irritable and tired.  Tried third time and doing better with it 2024.  Wellbutrin 300 Haldol dystonia  olanzapine for many years, ziprasidone, risperidone,  benzodiazepine dependence remotely, modafinil,  propranolol,  fluvoxamine,  Sertraline 100 Aricept no response, Exelon partial response, Nuvigil, Provigil Remote history of abuse of CBZ  Review of Systems:  Review of Systems  Constitutional:  Negative for fatigue.  Respiratory:  Positive for shortness of breath. Negative for cough.   Cardiovascular:  Negative for palpitations.  Genitourinary:        ED and ejac delay  Neurological:  Negative for tremors.       Myoclonic jerks not evident in the office but reported.  Psychiatric/Behavioral:  Positive for decreased concentration. Negative for agitation, behavioral problems, confusion, dysphoric mood, hallucinations,  self-injury, sleep disturbance and suicidal ideas. The patient is not nervous/anxious and is not hyperactive.   Not usually short of breath.  Medications: I have reviewed the patient's current medications.  Current Outpatient Medications  Medication Sig Dispense Refill   cloZAPine (CLOZARIL) 100 MG tablet Take 4 tablets (400 mg total) by mouth at bedtime. 120 tablet 1   lithium carbonate (ESKALITH) 450 MG ER tablet TAKE 1 TABLET(450 MG) BY MOUTH TWICE DAILY 60 tablet 0   FLUoxetine (PROZAC) 20 MG capsule TAKE 2 CAPSULES(40 MG) BY MOUTH DAILY 180 capsule 1   modafinil (PROVIGIL) 200 MG tablet TAKE 1 TABLET BY MOUTH IN THE MORNING AND AT 2 PM 180  tablet 1   propranolol (INDERAL) 20 MG tablet TAKE 2 TO 3 TABLETS BY MOUTH TWICE DAILY FOR TREMORS OR ANXIETY 180 tablet 0   No current facility-administered medications for this visit.    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  Tobacco Use   Smoking status: Former    Current packs/day: 0.00    Average packs/day: 1 pack/day for 31.4 years (31.4 ttl pk-yrs)    Types: Cigarettes    Start date: 03/12/1990    Quit date: 07/25/2021    Years since quitting: 1.4   Smokeless tobacco: Never   Tobacco comments:    Quit smoking 07/2021  Vaping Use   Vaping status: Never Used  Substance and Sexual Activity   Alcohol use: No   Drug use: No   Sexual activity: Not on file  Other Topics Concern   Not on  file  Social History Narrative   Not on file   Social Drivers of Health   Financial Resource Strain: Not on file  Food Insecurity: Not on file  Transportation Needs: Not on file  Physical Activity: Not on file  Stress: Not on file  Social Connections: Not on file  Intimate Partner Violence: Not on file    Past Medical History, Surgical history, Social history, and Family history were reviewed and updated as appropriate.   Please see review of systems for further details on the patient's review from today.     Objective:   Physical Exam:  There were no vitals taken for this visit.  Physical Exam Constitutional:      General: He is not in acute distress.    Appearance: He is well-developed.  Musculoskeletal:        General: No deformity.  Neurological:     Mental Status: He is alert and oriented to person, place, and time.     Motor: No tremor.     Coordination: Coordination normal.     Gait: Gait normal.  Psychiatric:        Attention and Perception: Attention and perception normal. He is attentive.        Mood and Affect: Mood is not anxious or depressed. Affect is not labile or angry.        Speech: Speech normal.        Behavior: Behavior normal.        Thought Content: Thought content normal. Thought content is not paranoid or delusional. Thought content does not include homicidal or suicidal ideation. Thought content does not include suicidal plan.        Cognition and Memory: Memory is not impaired. He does not exhibit impaired recent memory.        Judgment: Judgment normal.     Comments: Insight is good. Anxiety is better No paranoia.     Lab Review:     Component  Value Date/Time   NA 141 03/09/2007 2256   K 4.1 03/09/2007 2256   CL 105 03/09/2007 2256   CO2 28 03/09/2007 2256   GLUCOSE 107 (H) 03/09/2007 2256   BUN 7 03/09/2007 2256   CREATININE 0.94 03/09/2007 2256   CALCIUM 9.5 03/09/2007 2256   GFRNONAA >60 03/09/2007 2256   GFRAA  03/09/2007  2256    >60        The eGFR has been calculated using the MDRD equation. This calculation has not been validated in all clinical       Component Value Date/Time   WBC 6.1 01/04/2020 0853   WBC 4.7 03/09/2007 2256   RBC 5.28 01/04/2020 0853   RBC 5.06 03/09/2007 2256   HGB 15.7 01/04/2020 0853   HCT 47.0 01/04/2020 0853   PLT 171 01/04/2020 0853   MCV 89 01/04/2020 0853   MCH 29.7 01/04/2020 0853   MCHC 33.4 01/04/2020 0853   MCHC 35.3 03/09/2007 2256   RDW 13.4 01/04/2020 0853   LYMPHSABS 1.7 01/04/2020 0853   MONOABS 0.4 03/09/2007 2256   EOSABS 0.0 01/04/2020 0853   BASOSABS 0.1 01/04/2020 0853    No results found for: "POCLITH", "LITHIUM"   Lab Results  Component Value Date   VALPROATE 78.4 03/15/2007    09/25/21 lithium level 0.8   12/18/22 lithium level 1.0 on 600 mg daily   .res Assessment: Plan:    Schizoaffective disorder, bipolar type (HCC) - Plan: Lithium level, CBC with Differential/Platelet, CANCELED: CBC with Differential/Platelet  Lithium use - Plan: Lithium level  Encounter for long-term (current) use of medications - Plan: CBC with Differential/Platelet, CANCELED: CBC with Differential/Platelet  Social anxiety disorder - Plan: propranolol (INDERAL) 20 MG tablet  Mixed obsessional thoughts and acts - Plan: FLUoxetine (PROZAC) 20 MG capsule  Generalized anxiety disorder  Obstructive sleep apnea - Plan: modafinil (PROVIGIL) 200 MG tablet  Mild cognitive impairment   Disc smoking cessation.  He's stopped completely with Chantix for 2 mos.    His schizoaffective disorder symptoms including paranoia and irritability are well controlled with the current medications.  Including the clozapine.  His CBC with differential has been stable and unremarkable.  These have been recorded in the paper chart because it is difficult to send the results to the pharmacy and our REMS through epic we discussed the side effects of clozapine in detail.    We have  tried to mitigate excessive tiredness from clozapine that through the use of modafinil or Nuvigil with partial success.Consider switch to [facility]. Helped to increase modafinil 400 mg daily.  He's using CPAP regularly. Dr. Craige Cotta checked it out.  For SOB see PCP.  He has mild cognitive impairment is better with less clozapine and modafinil  Sertraline helped OCD some but sexual SE.  Concerns about DDI with clozapine from fluvoxamine and fluoxetine which both have probably less sex SE.  Least risk probably with fluoxetine so will switch to it.  Disc Ddi and risk sedation. Continue fluoxetine 40 mg daily  He was having myoclonic jerks in legs and arms likely from clozapine but better and no falls..   Myoclonic jerks are better nearly gone  OK with DC lamotrigine   ANC levels are within normal limits.  Options for labs [facility] or [facility]  TSH and lithium level DT Fatigue.  Done and is OK normal B12 and folate DT worsening cognition June 2022 He and this office are having difficulties dealing with his current lab which is an  independent laboratory that does not send results in epic. Continue lithium 450 BID  Consider memantine for cognition if not better.  Disc DDI clozapine and smoking.  Evidence suggests 40% lower levels in smokers which may require reducing the clozapine as he quits smoking.  He has reduced from 700 to 500 mg in the past. Disc Chantix and smoking cessation.   If stays off smoking then may be able to reduce clozapine again.  He feels a little more drowsy  Has done well with reduction clozapine to 300 mg daily DT fatigue and cognive complaints.    Disc relapse risk.  His paranoia had worsened at 400 mg daily previously but DC smoking is probably helping [facility] DDI.  We discussed the risk of worsening paranoia if we reduce the dosage.   He agrees No problems with this. Cannot go lower.  Counseled patient regarding potential benefits, risks, and side effects of lithium to include  potential risk of lithium affecting thyroid and renal function.  Discussed need for periodic lab monitoring to determine drug level and to assess for potential adverse effects.  Counseled patient regarding signs and symptoms of lithium toxicity and advised that they notify office immediately or seek urgent medical attention if experiencing these signs and symptoms.  Patient advised to contact office with any questions or concerns. 12/18/22 lithium level 1.0 on 600 mg daily  No med changes:   Follow-up 4 mos  Meredith Staggers, MD, DFAPA   No future appointments.         -------------------------------

## 2023-01-16 ENCOUNTER — Other Ambulatory Visit: Payer: Self-pay | Admitting: Psychiatry

## 2023-01-16 DIAGNOSIS — F4010 Social phobia, unspecified: Secondary | ICD-10-CM

## 2023-01-21 ENCOUNTER — Other Ambulatory Visit: Payer: Self-pay

## 2023-01-21 ENCOUNTER — Other Ambulatory Visit: Payer: Self-pay | Admitting: Psychiatry

## 2023-01-21 DIAGNOSIS — Z79899 Other long term (current) drug therapy: Secondary | ICD-10-CM

## 2023-01-24 LAB — CBC WITH DIFFERENTIAL/PLATELET
Basophils Absolute: 0 10*3/uL (ref 0.0–0.2)
Basos: 0 %
EOS (ABSOLUTE): 0 10*3/uL (ref 0.0–0.4)
Eos: 0 %
Hematocrit: 44.2 % (ref 37.5–51.0)
Hemoglobin: 14.3 g/dL (ref 13.0–17.7)
Immature Grans (Abs): 0.1 10*3/uL (ref 0.0–0.1)
Immature Granulocytes: 1 %
Lymphocytes Absolute: 1.2 10*3/uL (ref 0.7–3.1)
Lymphs: 13 %
MCH: 28.9 pg (ref 26.6–33.0)
MCHC: 32.4 g/dL (ref 31.5–35.7)
MCV: 89 fL (ref 79–97)
Monocytes Absolute: 0.7 10*3/uL (ref 0.1–0.9)
Monocytes: 7 %
Neutrophils Absolute: 7.1 10*3/uL — ABNORMAL HIGH (ref 1.4–7.0)
Neutrophils: 79 %
Platelets: 166 10*3/uL (ref 150–450)
RBC: 4.95 x10E6/uL (ref 4.14–5.80)
RDW: 13.1 % (ref 11.6–15.4)
WBC: 9 10*3/uL (ref 3.4–10.8)

## 2023-02-15 ENCOUNTER — Telehealth: Payer: Self-pay | Admitting: Psychiatry

## 2023-02-15 ENCOUNTER — Other Ambulatory Visit: Payer: Self-pay | Admitting: Psychiatry

## 2023-02-15 DIAGNOSIS — F250 Schizoaffective disorder, bipolar type: Secondary | ICD-10-CM

## 2023-02-15 MED ORDER — LITHIUM CARBONATE ER 450 MG PO TBCR: 900.0000 mg | EXTENDED_RELEASE_TABLET | Freq: Every evening | ORAL | 0 refills | Status: DC

## 2023-02-15 MED ORDER — CLOZAPINE 100 MG PO TABS: 400.0000 mg | ORAL_TABLET | Freq: Every day | ORAL | 1 refills | Status: DC

## 2023-02-15 NOTE — Telephone Encounter (Signed)
Pt called at 1:23p.  He said the pharmacy he has been using [place], [facility]

## 2023-02-15 NOTE — Telephone Encounter (Signed)
Has closed up.  He is asking for all of his meds to be directed to  [facility] in [place]. He said that he thinks there are pending results for his Clozapine and he wants to make sure that it and all the other meds are set up to the same new pharmacy location.  Next appt 4/30

## 2023-02-15 NOTE — Telephone Encounter (Signed)
[facility] to [facility].  CBC was normal.  He needs to remember to take the lab orde rfor lithium next time he is due for labs in early FEb

## 2023-02-18 NOTE — Telephone Encounter (Signed)
Pot notified

## 2023-03-13 ENCOUNTER — Other Ambulatory Visit: Payer: Self-pay | Admitting: Psychiatry

## 2023-03-14 LAB — CBC WITH DIFFERENTIAL/PLATELET
Basophils Absolute: 0 10*3/uL (ref 0.0–0.2)
Basos: 1 %
EOS (ABSOLUTE): 0 10*3/uL (ref 0.0–0.4)
Eos: 0 %
Hematocrit: 41.2 % (ref 37.5–51.0)
Hemoglobin: 13.8 g/dL (ref 13.0–17.7)
Immature Grans (Abs): 0 10*3/uL (ref 0.0–0.1)
Immature Granulocytes: 0 %
Lymphocytes Absolute: 1.6 10*3/uL (ref 0.7–3.1)
Lymphs: 30 %
MCH: 28.7 pg (ref 26.6–33.0)
MCHC: 33.5 g/dL (ref 31.5–35.7)
MCV: 86 fL (ref 79–97)
Monocytes Absolute: 0.4 10*3/uL (ref 0.1–0.9)
Monocytes: 7 %
Neutrophils Absolute: 3.2 10*3/uL (ref 1.4–7.0)
Neutrophils: 62 %
Platelets: 178 10*3/uL (ref 150–450)
RBC: 4.81 x10E6/uL (ref 4.14–5.80)
RDW: 13.3 % (ref 11.6–15.4)
WBC: 5.2 10*3/uL (ref 3.4–10.8)

## 2023-04-15 ENCOUNTER — Other Ambulatory Visit: Payer: Self-pay | Admitting: Psychiatry

## 2023-04-16 LAB — CBC WITH DIFFERENTIAL/PLATELET
Basophils Absolute: 0 10*3/uL (ref 0.0–0.2)
Basos: 1 %
EOS (ABSOLUTE): 0 10*3/uL (ref 0.0–0.4)
Eos: 0 %
Hematocrit: 42.1 % (ref 37.5–51.0)
Hemoglobin: 14 g/dL (ref 13.0–17.7)
Immature Grans (Abs): 0 10*3/uL (ref 0.0–0.1)
Immature Granulocytes: 0 %
Lymphocytes Absolute: 1.1 10*3/uL (ref 0.7–3.1)
Lymphs: 28 %
MCH: 29 pg (ref 26.6–33.0)
MCHC: 33.3 g/dL (ref 31.5–35.7)
MCV: 87 fL (ref 79–97)
Monocytes Absolute: 0.4 10*3/uL (ref 0.1–0.9)
Monocytes: 9 %
Neutrophils Absolute: 2.5 10*3/uL (ref 1.4–7.0)
Neutrophils: 62 %
Platelets: 154 10*3/uL (ref 150–450)
RBC: 4.83 x10E6/uL (ref 4.14–5.80)
RDW: 13.7 % (ref 11.6–15.4)
WBC: 4.1 10*3/uL (ref 3.4–10.8)

## 2023-04-18 ENCOUNTER — Other Ambulatory Visit: Payer: Self-pay | Admitting: Psychiatry

## 2023-04-18 DIAGNOSIS — F250 Schizoaffective disorder, bipolar type: Secondary | ICD-10-CM

## 2023-05-15 ENCOUNTER — Ambulatory Visit (INDEPENDENT_AMBULATORY_CARE_PROVIDER_SITE_OTHER): Payer: Medicare Other | Admitting: Psychiatry

## 2023-05-15 ENCOUNTER — Encounter: Payer: Self-pay | Admitting: Psychiatry

## 2023-05-15 DIAGNOSIS — F250 Schizoaffective disorder, bipolar type: Secondary | ICD-10-CM | POA: Diagnosis not present

## 2023-05-15 DIAGNOSIS — F422 Mixed obsessional thoughts and acts: Secondary | ICD-10-CM

## 2023-05-15 DIAGNOSIS — Z79899 Other long term (current) drug therapy: Secondary | ICD-10-CM

## 2023-05-15 DIAGNOSIS — G4733 Obstructive sleep apnea (adult) (pediatric): Secondary | ICD-10-CM

## 2023-05-15 DIAGNOSIS — F411 Generalized anxiety disorder: Secondary | ICD-10-CM

## 2023-05-15 DIAGNOSIS — F4010 Social phobia, unspecified: Secondary | ICD-10-CM | POA: Diagnosis not present

## 2023-05-15 DIAGNOSIS — G3184 Mild cognitive impairment, so stated: Secondary | ICD-10-CM

## 2023-05-15 DIAGNOSIS — G253 Myoclonus: Secondary | ICD-10-CM

## 2023-05-15 DIAGNOSIS — F17203 Nicotine dependence unspecified, with withdrawal: Secondary | ICD-10-CM

## 2023-05-15 MED ORDER — PROPRANOLOL HCL 20 MG PO TABS
ORAL_TABLET | ORAL | 1 refills | Status: DC
Start: 2023-05-15 — End: 2023-06-14

## 2023-05-15 MED ORDER — LITHIUM CARBONATE ER 450 MG PO TBCR
900.0000 mg | EXTENDED_RELEASE_TABLET | Freq: Every evening | ORAL | 0 refills | Status: DC
Start: 2023-05-15 — End: 2023-11-14

## 2023-05-15 MED ORDER — MODAFINIL 200 MG PO TABS: ORAL_TABLET | ORAL | 1 refills | Status: DC

## 2023-05-15 MED ORDER — FLUOXETINE HCL 40 MG PO CAPS: 40.0000 mg | ORAL_CAPSULE | Freq: Every day | ORAL | 1 refills | Status: DC

## 2023-05-15 MED ORDER — CLOZAPINE 100 MG PO TABS
400.0000 mg | ORAL_TABLET | Freq: Every day | ORAL | 1 refills | Status: DC
Start: 2023-05-15 — End: 2023-09-05

## 2023-05-15 NOTE — Progress Notes (Signed)
 Matthew Reed [id number] 09-20-1975 48 y.o.    Subjective:   Patient ID:  Matthew Reed is a 48 y.o. (DOB July 30, 1975) male.  Chief Complaint:  Chief Complaint  Patient presents with   Follow-up   Depression   Anxiety   Medication Reaction   Other    FU smoking     HPI  Matthew Reed presents to the office today for follow-up of smoking and schizoaffective disorder, MCI and Se meds.  seen August 2020.  He wanted to have a trial of Wellbutrin  for smoking cessation that was initiated 150 mg in the morning to then increase to 300 mg, the usual dose.  Patient called October 5 with the following complaint:Pt is having pretty bad side effects he thinks its from the Lithium  he is on. Pt's leg start twitching then he falls down. He said its been going on for awhile, but has got worse.  He was given the following response from our office: Patient's complaints are most consistent with myoclonic jerks.  These can occur with lithium .  They can also occur with clozapine .  They are more common when clozapine  dosages are above 600 mg daily and the patient is taking 700 mg daily.  Serum lithium  level was 1.0.  Serum BMP is within normal limits including creatinine 1.26 calcium 9.1.  TSH is also normal at 3.1  His lithium  level is not high.  Usually lithium  related jerks do not lead to falls.  It is more likely this is related to clozapine .  The treatment for clozapine  related jerks is lamotrigine .  The last note from the patient states that he has already reduced the lithium  by half tablet daily.  If he has done that for over a week then the lithium  is not the cause and we need to start the lamotrigine .  We cannot reduce the lithium  further.  It typically takes about 1 week for reduction in lithium  dosage to lead to reduction in side effects.  If it has been 1 full week then it is necessary to start the low-dose lamotrigine  as noted below.  If it has been less than 1 week he can wait until it has  been 1 week and then notify us .  Because it is a Friday I will go ahead and send in the prescription for lamotrigine  and he can choose to start it now or defer until he has been on the lower dose of lithium  for 1 full week.  Instructions on lamotrigine  dosing will be on the prescription.   TC  Reduced lithium  to 2 daily about 5 days and the jerks are improved.  Matthew Reed a couple of times.  Better now.    Has reduced the clozapine  from 700 to 600 mg daily.  He's reduced his smoking by 1/2 or more.  This will tend to increase the blood clozapine .  If the sx continue mid next week then call and may require checking blood level of clozapine  or adding lamotrigine .  Wellbutrin  has helped reduce the smoking.  There are several issues that could be going on here.  Both lithium  and clozapine  can clear trigger myoclonic jerks.  He is already reduced the lithium .  He is also reduced to clozapine  but he is also reduce smoking which will tend to increase the clozapine  blood level.  Clozapine  levels above 600 mg a day can trigger myoclonic jerks and potentially dose-related seizures.  Often lamotrigine  is used to counteract this problem.  However there have been  so many changes lately that it seems prudent to wait until next week once the lithium  levels are more stable in order to better evaluate the situation.  If the jerks continue then we will probably check a blood level of clozapine  and also add lamotrigine .  If jerks continue then call next week.  He agrees to this plan.  His lithium  level in October before he reduced lithium  was 1.0 Since that appointment he has reduced both lithium  and clozapine  because of jerks.   seen December 04, 2018 and the following was discussed. He just increased the lamotrigine  to 100 early November and hasn't seen changes with the jerks.  They are scary bc have caused falls 3-4 times since here but last time was bc tripped.  Legs and arms will jerk.  Less tremor lately with hands.   Legs will get week and shake and not feel strong enough to carry him.  If he tries to lift something over 20# it will happen.  No unusual back problems.  Hx sciatica resolved. Reduced smoking from 40 to 10 daily with Wellbutrin  150 helping.    OK trial Wellbutrin  XL  300 mg AM retrial for smoking cessation DT failure of Chantix  and nicotine patches.  Disc SE in detail.  Discussed the risk that this could increasing's anxiety or cause paranoia or mania.  Because of the severity of risk of smoking to his overall health it is reasonable to try this. Disc DDI clozapine  and smoking.  Evidence suggests 40% lower levels in smokers which may require reducing the clozapine  as he quits smoking.  He has reduced from 700 to 500 mg in the last week.   OK reduce clozapine  to 400 mg daily.  Disc relapse risk.  He called December 1 reporting the leg jerks were better but not gone and wanted an increase to low lamotrigine  to try to help with this problem.  It was increased from 100 to 150 mg daily at that time.  seen January 20, 2019. No problems are worse with reduction in clozapine  to 400 mg and sleepiness is better.  Still tends to worry over things too much.  Paranoia still controlled. No mood swings.   Cigarettes 10-12 daily.  Wellbutrin  helped him cut back.  Wants to stop. Jerks are 80-90% better and mainly when doing heavy lifting. He wanted to increase the Wellbutrin  XL to 450 mg daily to see if he could completely stop smoking and that dose change was made.  03/18/2019 appointment the following is noted: Started having more paranoia and increased clozapine  to 500 mg and the paranoia resolved. Forgot to increase Wellbutrin  and still smoking 7-10 days.  Working PT and it's good.  Just a few hours weekly.  Gets fatigued if does it too much. Jerks are 80% better and asks about increasing lamotrigine  from 225 mg AM. Still preaches and tolerated critique.  No paranoia.  Mood stable.  No mania.  Cut hours PT at work  bc too stressed and that helped.  Patient reports stable mood and denies depressed or irritable moods.   Patient denies difficulty with sleep initiation or maintenance. Sleep 11-1028. Using BiPap. Denies appetite disturbance.  Patient reports that energy and motivation have been good.  Patient denies any difficulty with concentration.  Patient denies any suicidal ideation. Asks about increasing Wellbutrin  to the max to try stop smoking completely.  He is aware of the risk of it causing more anxiety and paranoia.   Plan: Increase Wellbutrin  XL to  450 mg daily to maximize benefit for smoking cessation. Increase lamotrigine  to 2 daily to help with jerks.  06/17/2019 appointment the following is noted: Took Wellbutrin  450 mg for awhile and was cranky, moody and paranoid and stopped and it resolved off of it. It did help with smoking.  Increased smoking caused increased paranoia and needed to increase clozapine  to 600 mg daily from 500 mg daily. Some PT work stressed dealing with the public but usually can handle it if not around people so much. Sleep OK with bipap with good reading.  Machine is 49 years old. Still occ speaks at church. Jerking is a lot better but it still happens.  No falls. Propranolol  helps anxiety and tremor. Plan: For the jerks increase lamotrigine  to 2 tablets in the morning. Increase Wellbutrin  to 3 of the 150 mg tablets each morning. He forgot to do this last visit.  10/21/19 appt noted: Couldn't tolerate Wellbutrin  DT moodiness and stopped. CO OCD anxiety compulsively checks for keys and schedule bc fear of losing or forgetting things.  Hides his checking from others.  Occ may have to recheck but obsesses on it.  Wife doesn't notice his OCD.   Anxiety is not too bad otherwise.  Not paranoid.  Not depressed.   Patient reports stable mood and denies depressed or irritable moods.  Patient denies any recent difficulty with anxiety.  Patient denies difficulty with sleep initiation or  maintenance. Denies appetite disturbance.  Patient reports that energy and motivation have been good.  Patient denies any difficulty with concentration.  Patient denies any suicidal ideation. Grinding teeth he thinks at night.   No problems with other meds.   Less leg twitches now.   Gets tired about 2-3 in afternoon. Plan: Consider afternoon modafinil . Yes, 200 mg in AM and 100 mg in PM Wellbutrin  made him moody and he had to stop it.  02/22/2020 appointment with following noted: OCD is getting worse.  Repeated checking rituals around eating, not sure about anything.  So frustrating.  Checks lights when driving.   No reason for it to be worse unless stress but doesn't feel more stressed out. Fatigue and wants thyroid  checked.  No mood swings.  No mania.  Plan start sertraline  up to 100 mg daily for OCD type sx.  04/21/2020 appt noted: Sertraline  100 worked a little for OCD but affecting sex function and concerned about it. Everything else is great. Not having to check things as much but still does.  Still worries.  Does he have keys or schedule.  Doubts about what he is doing.   Taking energy shot drinks bc feels tired. Sleep doc pleased with bipap with good response. Not jittery with energy drinks and doesn't affect sleep. Increase modafinil  to 300 mg daily. Plan: Sertraline  helped OCD some but sexual SE.  Concerns about DDI with clozapine  from fluvoxamine and fluoxetine  which both have probably less sex SE.  Least risk probably with fluoxetine  so will switch to it.  Disc Ddi and risk sedation. Switch sertraline  to fluoxetine  40 mg daily  Reduce sertraline  to 1/2 tablet and start fluoxetine  1of the 20 mg capsule daily for 1 week. Then increase fluoxetine  to 2 capsules daily and stop sertraline .   05/17/2020 phone call: Patient complaining that clozapine  was making him sick excessively sleepy. MD response: He is currently taking clozapine  600 mg in the evening. He reduced the clozapine  dose  in 2017 and gradually had worsening anxiety and paranoia at 500 mg daily.  However  if he is willing to take that risk he can reduce the clozapine  to 500 mg at night.  06/22/2020 appointment with the following noted: Still OCD sx not any better and no different from when on sertraline .  Checks things repeatedly, schedule locks.   Also still drowsy after reducing the clozapine  to 500 mg at night. Seen with wife who said there were couple of instances of paranoia at work.  He mentioned to her a week ago.  She says day to day he seems ok.   Matthew Reed concerned about his memory.  He can't remember things and it wears her out.  She has to repeat instructions to him repeatedly.  Can't depend on him to keep up with stuff.   Pulm saw him and his AHI was 0.3. She sees general cognitive decline. Plan: Reduce lamotrigine  to 1-1/2 tablets in the morning to see if it helps memory increase modafinil  to 2 tablets daily Check vitamin B12 and folate level they were normal.  11/16/2020 appointment with the following noted: Will be watching new grandchild in December. Doing good.  Work PT [employer]. Feel like he's not getting enough oxygen.   Sleep variable.  Paranoia under control. Sleep good and using CPAP.  05/17/21 appt noted: CC tired and forgetful. On clozapine  to 600 mg daily.  Asks to reduce. Otherwise good mood and anxiety.   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   Plan: OK trial with reduction clozapine  to 500 mg daily DT fatigue and cognive complaints.     08/17/21 appt noted: Pt called early July asking to reduce clozapine  again to 400 mg .  Disc risk relapse but he wants to pursue anyway so it was agreed. Not as groggy and tired.  Mind can go blank.  Sometimes a  little more overwhelmed but brief.  No paranoia or irritability. M in Law  with [place] and wife there everyday.   PT work very low stress.  No problems dealing with people. Quit smoking and thinks that enabled him to reduce clozapine . Chantix  helped him and off for  4 weeks. Other meds: no lamotrigine , fluoxetine  40, modafinil  200 BID, lihtium 450 BID, propranolol  20 BID and 40 PM. Sleep is pretty good and less groggy on awakening.  At least 8-9 hours.  No sig napping. No falls in a long time until yesterday.  No sig jerks. Plan: OK trial with reduction clozapine  to 400 mg daily DT fatigue and cognive complaints.    12/06/21 appt noted: M in law stage 4 cancer and wife helps care for her. Reduced clozapine  400 mg HS and a little harder to go to sleep and less tired in the day.    To bed 11 and up by 9 AM. No anxiety, anger, irritability or anger being worse. Nothing worse with less clozapine . Doing low stress PT job.   Down to 3-4 cig per day with benefit of Chantix .  If misses Chantix  then strong craving. Less cough and SOB. Drowsiness is not that bad in the day usually.  Drinking less coffee.   Propranolol  helps anxiety and tremor. Uses [employer] Medicare D plan and works well. Focus better in speaking  03/08/22 appt noted: Quit smoking for 2 weeks.  Wife quitting too with patches. Doing good overall.  No paranoia or mood swings.   No problems with meds other than sleepiness.   Still active at church.  Has lost some leaders in church.  His father comes to church some.    04/05/22 TC with staff:  Patient c/o being too drowsy with 400 mg clozapine . He is asking to cut down to 300 mg. He said he had not smoked in 2 months. He said if he started having sx he would increase it. He currently has RF for 600 mg.     MD resp:  He can reduce clozapine  100 mg tablets from 4 nightly to 3 nightly.  It must be reduced slowly otherwise paranoia will recur.     06/06/22 appt noted: Meds: clozaine 300 mg HS,  No smoking for over 2 mos.  Chantix  helps.   If misses more craving cig.   Wife also quit smoking.  Saving money No problems with reduction in clozapine  to 300 mg HS. Less sleepiness and tiredness.  No mood swings.  No sig paranoia. Anxiety manageable.   No  complaints. Sleep about 11-9.   No depression.  Family doing good.  Parents still living.   Plan: no changes  09/12/22 appt noted: Psych meds: Clozapine  300 nightly, fluoxetine  40 mg daily, lithium  ER 450 twice daily, modafinil  200 mg twice daily, propranolol  20 mg 2 to 3 tablets twice daily as needed for tremor or anxiety, stopped Chantix  1 mg twice daily No smoking for months. Doing pretty good.  Enjoys PT work.  Managed difficult coworker.  GD on the way.   Mood good.  No mood swings.  No complaints.   Sleep about 11-9 .  Benefit with meds.   No SE concerns.  Less sedation with less clozapine . Plan no changes  01/14/23 appt noted:  Psych meds as above  D near delivery of baby.  Will be GD.  She has son 3 mos old. Doing great on meds mentally.   Biggest problem is getting labs and results and clozapine .  May look into lab change bc that is the problem.    05/15/23 appt noted: Med: Clozapine  300 nightly, fluoxetine  40 mg daily, lithium  ER 450 twice daily, modafinil  200 mg twice daily, propranolol  20 mg 2 to 3 tablets twice daily as needed for tremor or anxiety,  No smoking for 8 months. No  SE .  No sig trmeor.  No further falling.   Sleep is ok.  Avg 8-9 hours.   M in law dying in hospital.  He's doing things wife used to do.   Modafinil  still helps.  Tried if misses it.     Kids: 48, 17, 42, 64 GSON sees weekly.  They live in [place].  He reduced the clozapine  dose and 2017 and gradually had worsening anxiety and paranoia at 500 mg daily.  Past Psychiatric Medication Trials:  On clozapine  700 since July 2018 to August 2020.  Chantix  N and irritable and tired.  Tried third time and doing better with it 2024.  Wellbutrin  300 Haldol dystonia  olanzapine for many years, ziprasidone, risperidone,  benzodiazepine dependence remotely, modafinil ,  propranolol ,  fluvoxamine,  Sertraline  100 Aricept no response, Exelon  partial response, Nuvigil, Provigil  Remote history of abuse of  CBZ  Review of Systems:  Review of Systems  Constitutional:  Negative for fatigue.  Respiratory:  Positive for shortness of breath. Negative for cough.   Cardiovascular:  Negative for palpitations.  Genitourinary:        ED and ejac delay  Neurological:  Negative for tremors.       Myoclonic jerks not evident in the office but reported.  Psychiatric/Behavioral:  Positive for decreased concentration. Negative for agitation, behavioral problems, confusion, dysphoric mood, hallucinations, self-injury, sleep disturbance and suicidal  ideas. The patient is not nervous/anxious and is not hyperactive.   Not usually short of breath.  Medications: I have reviewed the patient's current medications.  Current Outpatient Medications  Medication Sig Dispense Refill   cloZAPine  (CLOZARIL ) 100 MG tablet Take 4 tablets (400 mg total) by mouth at bedtime. 120 tablet 1   FLUoxetine  (PROZAC ) 40 MG capsule Take 1 capsule (40 mg total) by mouth daily. 90 capsule 1   lithium  carbonate (ESKALITH ) 450 MG ER tablet Take 2 tablets (900 mg total) by mouth at bedtime. 180 tablet 0   modafinil  (PROVIGIL ) 200 MG tablet TAKE 1 TABLET BY MOUTH IN THE MORNING AND AT 2 PM 180 tablet 1   propranolol  (INDERAL ) 20 MG tablet TAKE 2 TO 3 TABLETS BY MOUTH TWICE DAILY FOR TREMORS OR ANXIETY 180 tablet 1   No current facility-administered medications for this visit.    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  Tobacco Use   Smoking status: Former    Current packs/day: 0.00    Average packs/day: 1 pack/day for 31.4 years (31.4 ttl pk-yrs)    Types: Cigarettes    Start date: 03/12/1990    Quit date: 07/25/2021    Years since quitting: 1.8   Smokeless tobacco: Never   Tobacco comments:    Quit smoking 07/2021  Vaping Use   Vaping status: Never Used  Substance and Sexual Activity   Alcohol use: No   Drug use: No   Sexual activity: Not on file  Other Topics Concern   Not on file  Social History Narrative   Not on file   Social Drivers of Health   Financial Resource Strain: Not on file  Food Insecurity: Not on file  Transportation Needs: Not on file  Physical Activity: Not on file  Stress: Not on file  Social Connections: Not on file  Intimate Partner Violence: Not on file    Past Medical History, Surgical history, Social history, and Family history were reviewed and updated as appropriate.   Please see review of systems for further details on the patient's review from today.     Objective:   Physical Exam:  There were no vitals taken for this visit.  Physical Exam Constitutional:      General: He is not in acute distress.    Appearance: He is well-developed.  Musculoskeletal:        General: No deformity.  Neurological:     Mental Status: He is alert and oriented to person, place, and time.     Motor: No tremor.     Coordination: Coordination normal.     Gait: Gait normal.  Psychiatric:        Attention and Perception: Attention and perception normal. He is attentive.        Mood and Affect: Mood is not anxious or depressed.  Affect is not labile or angry.        Speech: Speech normal.        Behavior: Behavior normal. Behavior is not slowed.        Thought Content: Thought content normal. Thought content is not paranoid or delusional. Thought content does not include homicidal or suicidal ideation. Thought content does not include  suicidal plan.        Cognition and Memory: Memory is not impaired. He does not exhibit impaired recent memory.        Judgment: Judgment normal.     Comments: Insight is good. Anxiety is better No paranoia.     Lab Review:     Component Value Date/Time   NA 141 03/09/2007 2256   K 4.1 03/09/2007 2256   CL 105 03/09/2007 2256   CO2 28 03/09/2007 2256   GLUCOSE 107 (H) 03/09/2007 2256   BUN 7 03/09/2007 2256   CREATININE 0.94 03/09/2007 2256   CALCIUM 9.5 03/09/2007 2256   GFRNONAA >60 03/09/2007 2256   GFRAA  03/09/2007 2256    >60        The eGFR has been calculated using the MDRD equation. This calculation has not been validated in all clinical       Component Value Date/Time   WBC 4.1 04/15/2023 1008   WBC 4.7 03/09/2007 2256   RBC 4.83 04/15/2023 1008   RBC 5.06 03/09/2007 2256   HGB 14.0 04/15/2023 1008   HCT 42.1 04/15/2023 1008   PLT 154 04/15/2023 1008   MCV 87 04/15/2023 1008   MCH 29.0 04/15/2023 1008   MCHC 33.3 04/15/2023 1008   MCHC 35.3 03/09/2007 2256   RDW 13.7 04/15/2023 1008   LYMPHSABS 1.1 04/15/2023 1008   MONOABS 0.4 03/09/2007 2256   EOSABS 0.0 04/15/2023 1008   BASOSABS 0.0 04/15/2023 1008    No results found for: "POCLITH", "LITHIUM "   Lab Results  Component Value Date   VALPROATE 78.4 03/15/2007    09/25/21 lithium  level 0.8   12/18/22 lithium  level 1.0 on 900 mg daily   .res Assessment: Plan:    Schizoaffective disorder, bipolar type (HCC) - Plan: Lithium  level, cloZAPine  (CLOZARIL ) 100 MG tablet, lithium  carbonate (ESKALITH ) 450 MG ER tablet  Long term current use of clozapine  - Plan: Lithium  level  Lithium  use - Plan: Lithium  level  Social anxiety disorder - Plan: propranolol  (INDERAL ) 20 MG tablet  Mixed obsessional thoughts and acts - Plan: FLUoxetine  (PROZAC ) 40 MG capsule  Generalized anxiety disorder  Obstructive sleep apnea - Plan: modafinil  (PROVIGIL ) 200 MG tablet  Mild cognitive impairment - Plan: B12 and  Folate Panel  Myoclonic jerking  Nicotine dependence with withdrawal, unspecified nicotine product type   Disc smoking cessation.  He's stopped completely with Chantix  for 8 mos been off for mos.    His schizoaffective disorder symptoms including paranoia and irritability are well controlled with the current medications.  Including the clozapine .  His CBC with differential has been stable and unremarkable.  These have been recorded in the paper chart because it is difficult to send the results to the pharmacy and our REMS through epic we discussed the side effects of clozapine  in detail.    We have tried to mitigate excessive tiredness from clozapine  that through the use of modafinil  or Nuvigil with partial success.Consider switch to Sunosi. Helped to increase modafinil  400 mg daily.  He's using CPAP regularly. Dr. Matilde Son checked it out.  For SOB see PCP.  He  has mild cognitive impairment is better with less clozapine  and modafinil  but still a px.  Trial B12 for memeory concerns.Consider memantine for cognition if not better.  Sertraline  helped OCD some but sexual SE.  Concerns about DDI with clozapine  from fluvoxamine and fluoxetine  which both have probably less sex SE.  Least risk probably with fluoxetine  so will switch to it.  Disc Ddi and risk sedation. Continue fluoxetine  40 mg daily  He was having myoclonic jerks in legs and arms likely from clozapine  but better and no falls..   Myoclonic jerks are better nearly gone  OK with DC lamotrigine    ANC levels are within normal limits.  Options for labs [facility] or [facility]  TSH and lithium  level DT Fatigue.  Done and is OK normal B12 and folate DT worsening cognition June 2022 He and this office are having difficulties dealing with his current lab which is an independent laboratory that does not send results in epic. Continue lithium  450 BID  Has done well with reduction clozapine  to 300 mg daily DT fatigue and cognive complaints.    Disc  relapse risk.  His paranoia had worsened at 400 mg daily previously but DC smoking is probably helping [facility] DDI.  We discussed the risk of worsening paranoia if we reduce the dosage.   He agrees No problems with this. Cannot go lower.  Counseled patient regarding potential benefits, risks, and side effects of lithium  to include potential risk of lithium  affecting thyroid  and renal function.  Discussed need for periodic lab monitoring to determine drug level and to assess for potential adverse effects.  Counseled patient regarding signs and symptoms of lithium  toxicity and advised that they notify office immediately or seek urgent medical attention if experiencing these signs and symptoms.  Patient advised to contact office with any questions or concerns. 12/18/22 lithium  level 1.0 on 900 mg daily  No med changes:   Follow-up 6 mos  Nori Beat, MD, DFAPA   No future appointments.         -------------------------------

## 2023-05-22 ENCOUNTER — Other Ambulatory Visit: Payer: Self-pay | Admitting: Psychiatry

## 2023-05-23 LAB — CBC WITH DIFFERENTIAL/PLATELET
Basophils Absolute: 0 10*3/uL (ref 0.0–0.2)
Basos: 1 %
EOS (ABSOLUTE): 0 10*3/uL (ref 0.0–0.4)
Eos: 0 %
Hematocrit: 43.8 % (ref 37.5–51.0)
Hemoglobin: 14.6 g/dL (ref 13.0–17.7)
Immature Grans (Abs): 0 10*3/uL (ref 0.0–0.1)
Immature Granulocytes: 0 %
Lymphocytes Absolute: 1.5 10*3/uL (ref 0.7–3.1)
Lymphs: 25 %
MCH: 29 pg (ref 26.6–33.0)
MCHC: 33.3 g/dL (ref 31.5–35.7)
MCV: 87 fL (ref 79–97)
Monocytes Absolute: 0.4 10*3/uL (ref 0.1–0.9)
Monocytes: 6 %
Neutrophils Absolute: 4.1 10*3/uL (ref 1.4–7.0)
Neutrophils: 68 %
Platelets: 190 10*3/uL (ref 150–450)
RBC: 5.04 x10E6/uL (ref 4.14–5.80)
RDW: 14 % (ref 11.6–15.4)
WBC: 6 10*3/uL (ref 3.4–10.8)

## 2023-05-23 LAB — B12 AND FOLATE PANEL
Folate: 7.8 ng/mL (ref >3.0)
Vitamin B-12: 324 pg/mL (ref 232–1245)

## 2023-05-23 LAB — LITHIUM LEVEL: Lithium Lvl: 0.6 mmol/L (ref 0.5–1.2)

## 2023-05-24 ENCOUNTER — Encounter: Payer: Self-pay | Admitting: Psychiatry

## 2023-05-24 NOTE — Progress Notes (Signed)
 Lithium  level good 0.6 Normal CBC.  Vitamin B-12 324 Folate 7.8 Cognitive complaints with low normal b12.  Tell pt to start taking OTC B12 tablet each morning.  It might help his memory a bit. Matthew Beat, MD, DFAPA

## 2023-06-14 ENCOUNTER — Other Ambulatory Visit: Payer: Self-pay | Admitting: Psychiatry

## 2023-06-14 DIAGNOSIS — F4010 Social phobia, unspecified: Secondary | ICD-10-CM

## 2023-06-17 ENCOUNTER — Other Ambulatory Visit: Payer: Self-pay | Admitting: Psychiatry

## 2023-06-18 LAB — CBC WITH DIFFERENTIAL/PLATELET
Basophils Absolute: 0 10*3/uL (ref 0.0–0.2)
Basos: 1 %
EOS (ABSOLUTE): 0 10*3/uL (ref 0.0–0.4)
Eos: 0 %
Hematocrit: 44.3 % (ref 37.5–51.0)
Hemoglobin: 14.6 g/dL (ref 13.0–17.7)
Immature Grans (Abs): 0 10*3/uL (ref 0.0–0.1)
Immature Granulocytes: 0 %
Lymphocytes Absolute: 1.2 10*3/uL (ref 0.7–3.1)
Lymphs: 22 %
MCH: 29.1 pg (ref 26.6–33.0)
MCHC: 33 g/dL (ref 31.5–35.7)
MCV: 88 fL (ref 79–97)
Monocytes Absolute: 0.3 10*3/uL (ref 0.1–0.9)
Monocytes: 6 %
Neutrophils Absolute: 3.8 10*3/uL (ref 1.4–7.0)
Neutrophils: 71 %
Platelets: 161 10*3/uL (ref 150–450)
RBC: 5.01 x10E6/uL (ref 4.14–5.80)
RDW: 14.3 % (ref 11.6–15.4)
WBC: 5.3 10*3/uL (ref 3.4–10.8)

## 2023-06-18 LAB — B12 AND FOLATE PANEL
Folate: 5.7 ng/mL (ref >3.0)
Vitamin B-12: 593 pg/mL (ref 232–1245)

## 2023-06-18 LAB — LITHIUM LEVEL: Lithium Lvl: 0.5 mmol/L (ref 0.5–1.2)

## 2023-06-19 ENCOUNTER — Ambulatory Visit: Payer: Self-pay | Admitting: Psychiatry

## 2023-06-19 NOTE — Progress Notes (Signed)
 Normal B12 and folate levels. Lithium  0.5 stable low dose.   No changes indicated.

## 2023-07-23 ENCOUNTER — Other Ambulatory Visit: Payer: Self-pay | Admitting: Psychiatry

## 2023-07-24 LAB — CBC WITH DIFFERENTIAL/PLATELET
Basophils Absolute: 0 x10E3/uL (ref 0.0–0.2)
Basos: 1 %
EOS (ABSOLUTE): 0 x10E3/uL (ref 0.0–0.4)
Eos: 0 %
Hematocrit: 43.5 % (ref 37.5–51.0)
Hemoglobin: 14.3 g/dL (ref 13.0–17.7)
Immature Grans (Abs): 0 x10E3/uL (ref 0.0–0.1)
Immature Granulocytes: 0 %
Lymphocytes Absolute: 1.5 x10E3/uL (ref 0.7–3.1)
Lymphs: 29 %
MCH: 29.4 pg (ref 26.6–33.0)
MCHC: 32.9 g/dL (ref 31.5–35.7)
MCV: 90 fL (ref 79–97)
Monocytes Absolute: 0.3 x10E3/uL (ref 0.1–0.9)
Monocytes: 6 %
Neutrophils Absolute: 3.4 x10E3/uL (ref 1.4–7.0)
Neutrophils: 64 %
Platelets: 164 x10E3/uL (ref 150–450)
RBC: 4.86 x10E6/uL (ref 4.14–5.80)
RDW: 14.1 % (ref 11.6–15.4)
WBC: 5.3 x10E3/uL (ref 3.4–10.8)

## 2023-09-02 ENCOUNTER — Other Ambulatory Visit: Payer: Self-pay | Admitting: Psychiatry

## 2023-09-03 LAB — CBC WITH DIFFERENTIAL/PLATELET
Basophils Absolute: 0 x10E3/uL (ref 0.0–0.2)
Basos: 1 %
EOS (ABSOLUTE): 0.2 x10E3/uL (ref 0.0–0.4)
Eos: 3 %
Hematocrit: 44.1 % (ref 37.5–51.0)
Hemoglobin: 14.2 g/dL (ref 13.0–17.7)
Immature Grans (Abs): 0 x10E3/uL (ref 0.0–0.1)
Immature Granulocytes: 0 %
Lymphocytes Absolute: 1.4 x10E3/uL (ref 0.7–3.1)
Lymphs: 24 %
MCH: 29 pg (ref 26.6–33.0)
MCHC: 32.2 g/dL (ref 31.5–35.7)
MCV: 90 fL (ref 79–97)
Monocytes Absolute: 0.3 x10E3/uL (ref 0.1–0.9)
Monocytes: 6 %
Neutrophils Absolute: 3.7 x10E3/uL (ref 1.4–7.0)
Neutrophils: 65 %
Platelets: 160 x10E3/uL (ref 150–450)
RBC: 4.89 x10E6/uL (ref 4.14–5.80)
RDW: 13.7 % (ref 11.6–15.4)
WBC: 5.6 x10E3/uL (ref 3.4–10.8)

## 2023-09-05 ENCOUNTER — Telehealth: Payer: Self-pay | Admitting: Psychiatry

## 2023-09-05 ENCOUNTER — Other Ambulatory Visit: Payer: Self-pay

## 2023-09-05 DIAGNOSIS — F250 Schizoaffective disorder, bipolar type: Secondary | ICD-10-CM

## 2023-09-05 MED ORDER — CLOZAPINE 100 MG PO TABS: 400.0000 mg | ORAL_TABLET | Freq: Every day | ORAL | 2 refills | Status: DC

## 2023-09-05 NOTE — Telephone Encounter (Signed)
 Pended

## 2023-09-05 NOTE — Telephone Encounter (Signed)
 Next appt 10/30   Pt needs refill of Clozapine     [facility] [address]

## 2023-10-08 ENCOUNTER — Telehealth: Payer: Self-pay | Admitting: Psychiatry

## 2023-10-08 ENCOUNTER — Other Ambulatory Visit: Payer: Self-pay

## 2023-10-08 DIAGNOSIS — Z79899 Other long term (current) drug therapy: Secondary | ICD-10-CM

## 2023-10-08 NOTE — Telephone Encounter (Signed)
 Pt called asking for a CBC lab order to be faxed to [facility].. They told him they didn;t have it. The fax # is [phone]

## 2023-10-08 NOTE — Telephone Encounter (Signed)
 Order faxed.

## 2023-10-10 ENCOUNTER — Other Ambulatory Visit: Payer: Self-pay

## 2023-10-10 DIAGNOSIS — Z79899 Other long term (current) drug therapy: Secondary | ICD-10-CM

## 2023-10-11 LAB — CBC WITH DIFFERENTIAL/PLATELET
Basophils Absolute: 0 x10E3/uL (ref 0.0–0.2)
Basos: 1 %
EOS (ABSOLUTE): 0.1 x10E3/uL (ref 0.0–0.4)
Eos: 2 %
Hematocrit: 42.9 % (ref 37.5–51.0)
Hemoglobin: 14.1 g/dL (ref 13.0–17.7)
Immature Grans (Abs): 0.1 x10E3/uL (ref 0.0–0.1)
Immature Granulocytes: 1 %
Lymphocytes Absolute: 1.6 x10E3/uL (ref 0.7–3.1)
Lymphs: 26 %
MCH: 29.3 pg (ref 26.6–33.0)
MCHC: 32.9 g/dL (ref 31.5–35.7)
MCV: 89 fL (ref 79–97)
Monocytes Absolute: 0.4 x10E3/uL (ref 0.1–0.9)
Monocytes: 6 %
Neutrophils Absolute: 4 x10E3/uL (ref 1.4–7.0)
Neutrophils: 64 %
Platelets: 180 x10E3/uL (ref 150–450)
RBC: 4.81 x10E6/uL (ref 4.14–5.80)
RDW: 13.7 % (ref 11.6–15.4)
WBC: 6.1 x10E3/uL (ref 3.4–10.8)

## 2023-11-12 LAB — CBC WITH DIFFERENTIAL/PLATELET
Basophils Absolute: 0 x10E3/uL (ref 0.0–0.2)
Basos: 1 %
EOS (ABSOLUTE): 0 x10E3/uL (ref 0.0–0.4)
Eos: 0 %
Hematocrit: 44 % (ref 37.5–51.0)
Hemoglobin: 14.6 g/dL (ref 13.0–17.7)
Immature Grans (Abs): 0 x10E3/uL (ref 0.0–0.1)
Immature Granulocytes: 0 %
Lymphocytes Absolute: 1.4 x10E3/uL (ref 0.7–3.1)
Lymphs: 27 %
MCH: 29.9 pg (ref 26.6–33.0)
MCHC: 33.2 g/dL (ref 31.5–35.7)
MCV: 90 fL (ref 79–97)
Monocytes Absolute: 0.3 x10E3/uL (ref 0.1–0.9)
Monocytes: 6 %
Neutrophils Absolute: 3.4 x10E3/uL (ref 1.4–7.0)
Neutrophils: 65 %
Platelets: 167 x10E3/uL (ref 150–450)
RBC: 4.89 x10E6/uL (ref 4.14–5.80)
RDW: 13.7 % (ref 11.6–15.4)
WBC: 5.2 x10E3/uL (ref 3.4–10.8)

## 2023-11-14 ENCOUNTER — Encounter: Payer: Self-pay | Admitting: Psychiatry

## 2023-11-14 ENCOUNTER — Ambulatory Visit (INDEPENDENT_AMBULATORY_CARE_PROVIDER_SITE_OTHER): Admitting: Psychiatry

## 2023-11-14 DIAGNOSIS — Z79899 Other long term (current) drug therapy: Secondary | ICD-10-CM

## 2023-11-14 DIAGNOSIS — F4010 Social phobia, unspecified: Secondary | ICD-10-CM | POA: Diagnosis not present

## 2023-11-14 DIAGNOSIS — F411 Generalized anxiety disorder: Secondary | ICD-10-CM

## 2023-11-14 DIAGNOSIS — G253 Myoclonus: Secondary | ICD-10-CM

## 2023-11-14 DIAGNOSIS — F422 Mixed obsessional thoughts and acts: Secondary | ICD-10-CM | POA: Diagnosis not present

## 2023-11-14 DIAGNOSIS — G3184 Mild cognitive impairment, so stated: Secondary | ICD-10-CM

## 2023-11-14 DIAGNOSIS — G4733 Obstructive sleep apnea (adult) (pediatric): Secondary | ICD-10-CM

## 2023-11-14 DIAGNOSIS — F250 Schizoaffective disorder, bipolar type: Secondary | ICD-10-CM

## 2023-11-14 MED ORDER — FLUOXETINE HCL 40 MG PO CAPS: 40.0000 mg | ORAL_CAPSULE | Freq: Every day | ORAL | 1 refills | Status: AC

## 2023-11-14 MED ORDER — LITHIUM CARBONATE ER 450 MG PO TBCR: 900.0000 mg | EXTENDED_RELEASE_TABLET | Freq: Every evening | ORAL | 1 refills | Status: AC

## 2023-11-14 MED ORDER — MODAFINIL 200 MG PO TABS: ORAL_TABLET | ORAL | 1 refills | Status: AC

## 2023-11-14 MED ORDER — PROPRANOLOL HCL 20 MG PO TABS: ORAL_TABLET | ORAL | 1 refills | Status: AC

## 2023-11-14 NOTE — Progress Notes (Signed)
 Matthew Reed [id number] 1975-07-27 47 y.o.    Subjective:   Patient ID:  Matthew Reed is a 48 y.o. (DOB Jun 05, 1975) male.  Chief Complaint:  Chief Complaint  Patient presents with   Follow-up   Manic Behavior   Depression   Anxiety   Sleeping Problem     HPI  Matthew Reed presents to the office today for follow-up of smoking and schizoaffective disorder, MCI and Se meds.  seen August 2020.  He wanted to have a trial of Wellbutrin  for smoking cessation that was initiated 150 mg in the morning to then increase to 300 mg, the usual dose.  Patient called October 5 with the following complaint:Pt is having pretty bad side effects he thinks its from the Lithium  he is on. Pt's leg start twitching then he falls down. He said its been going on for awhile, but has got worse.  He was given the following response from our office: Patient's complaints are most consistent with myoclonic jerks.  These can occur with lithium .  They can also occur with clozapine .  They are more common when clozapine  dosages are above 600 mg daily and the patient is taking 700 mg daily.  Serum lithium  level was 1.0.  Serum BMP is within normal limits including creatinine 1.26 calcium 9.1.  TSH is also normal at 3.1  His lithium  level is not high.  Usually lithium  related jerks do not lead to falls.  It is more likely this is related to clozapine .  The treatment for clozapine  related jerks is lamotrigine .  The last note from the patient states that he has already reduced the lithium  by half tablet daily.  If he has done that for over a week then the lithium  is not the cause and we need to start the lamotrigine .  We cannot reduce the lithium  further.  It typically takes about 1 week for reduction in lithium  dosage to lead to reduction in side effects.  If it has been 1 full week then it is necessary to start the low-dose lamotrigine  as noted below.  If it has been less than 1 week he can wait until it has been 1 week  and then notify us .  Because it is a Friday I will go ahead and send in the prescription for lamotrigine  and he can choose to start it now or defer until he has been on the lower dose of lithium  for 1 full week.  Instructions on lamotrigine  dosing will be on the prescription.   TC  Reduced lithium  to 2 daily about 5 days and the jerks are improved.  Matthew Reed a couple of times.  Better now.    Has reduced the clozapine  from 700 to 600 mg daily.  He's reduced his smoking by 1/2 or more.  This will tend to increase the blood clozapine .  If the sx continue mid next week then call and may require checking blood level of clozapine  or adding lamotrigine .  Wellbutrin  has helped reduce the smoking.  There are several issues that could be going on here.  Both lithium  and clozapine  can clear trigger myoclonic jerks.  He is already reduced the lithium .  He is also reduced to clozapine  but he is also reduce smoking which will tend to increase the clozapine  blood level.  Clozapine  levels above 600 mg a day can trigger myoclonic jerks and potentially dose-related seizures.  Often lamotrigine  is used to counteract this problem.  However there have been so many changes lately  that it seems prudent to wait until next week once the lithium  levels are more stable in order to better evaluate the situation.  If the jerks continue then we will probably check a blood level of clozapine  and also add lamotrigine .  If jerks continue then call next week.  He agrees to this plan.  His lithium  level in October before he reduced lithium  was 1.0 Since that appointment he has reduced both lithium  and clozapine  because of jerks.   seen December 04, 2018 and the following was discussed. He just increased the lamotrigine  to 100 early November and hasn't seen changes with the jerks.  They are scary bc have caused falls 3-4 times since here but last time was bc tripped.  Legs and arms will jerk.  Less tremor lately with hands.  Legs will get  week and shake and not feel strong enough to carry him.  If he tries to lift something over 20# it will happen.  No unusual back problems.  Hx sciatica resolved. Reduced smoking from 40 to 10 daily with Wellbutrin  150 helping.    OK trial Wellbutrin  XL  300 mg AM retrial for smoking cessation DT failure of Chantix  and nicotine patches.  Disc SE in detail.  Discussed the risk that this could increasing's anxiety or cause paranoia or mania.  Because of the severity of risk of smoking to his overall health it is reasonable to try this. Disc DDI clozapine  and smoking.  Evidence suggests 40% lower levels in smokers which may require reducing the clozapine  as he quits smoking.  He has reduced from 700 to 500 mg in the last week.   OK reduce clozapine  to 400 mg daily.  Disc relapse risk.  He called December 1 reporting the leg jerks were better but not gone and wanted an increase to low lamotrigine  to try to help with this problem.  It was increased from 100 to 150 mg daily at that time.  seen January 20, 2019. No problems are worse with reduction in clozapine  to 400 mg and sleepiness is better.  Still tends to worry over things too much.  Paranoia still controlled. No mood swings.   Cigarettes 10-12 daily.  Wellbutrin  helped him cut back.  Wants to stop. Jerks are 80-90% better and mainly when doing heavy lifting. He wanted to increase the Wellbutrin  XL to 450 mg daily to see if he could completely stop smoking and that dose change was made.  03/18/2019 appointment the following is noted: Started having more paranoia and increased clozapine  to 500 mg and the paranoia resolved. Forgot to increase Wellbutrin  and still smoking 7-10 days.  Working PT and it's good.  Just a few hours weekly.  Gets fatigued if does it too much. Jerks are 80% better and asks about increasing lamotrigine  from 225 mg AM. Still preaches and tolerated critique.  No paranoia.  Mood stable.  No mania.  Cut hours PT at work bc too  stressed and that helped.  Patient reports stable mood and denies depressed or irritable moods.   Patient denies difficulty with sleep initiation or maintenance. Sleep 11-1028. Using BiPap. Denies appetite disturbance.  Patient reports that energy and motivation have been good.  Patient denies any difficulty with concentration.  Patient denies any suicidal ideation. Asks about increasing Wellbutrin  to the max to try stop smoking completely.  He is aware of the risk of it causing more anxiety and paranoia.   Plan: Increase Wellbutrin  XL to 450 mg daily to  maximize benefit for smoking cessation. Increase lamotrigine  to 2 daily to help with jerks.  06/17/2019 appointment the following is noted: Took Wellbutrin  450 mg for awhile and was cranky, moody and paranoid and stopped and it resolved off of it. It did help with smoking.  Increased smoking caused increased paranoia and needed to increase clozapine  to 600 mg daily from 500 mg daily. Some PT work stressed dealing with the public but usually can handle it if not around people so much. Sleep OK with bipap with good reading.  Machine is 49 years old. Still occ speaks at church. Jerking is a lot better but it still happens.  No falls. Propranolol  helps anxiety and tremor. Plan: For the jerks increase lamotrigine  to 2 tablets in the morning. Increase Wellbutrin  to 3 of the 150 mg tablets each morning. He forgot to do this last visit.  10/21/19 appt noted: Couldn't tolerate Wellbutrin  DT moodiness and stopped. CO OCD anxiety compulsively checks for keys and schedule bc fear of losing or forgetting things.  Hides his checking from others.  Occ may have to recheck but obsesses on it.  Wife doesn't notice his OCD.   Anxiety is not too bad otherwise.  Not paranoid.  Not depressed.   Patient reports stable mood and denies depressed or irritable moods.  Patient denies any recent difficulty with anxiety.  Patient denies difficulty with sleep initiation or  maintenance. Denies appetite disturbance.  Patient reports that energy and motivation have been good.  Patient denies any difficulty with concentration.  Patient denies any suicidal ideation. Grinding teeth he thinks at night.   No problems with other meds.   Less leg twitches now.   Gets tired about 2-3 in afternoon. Plan: Consider afternoon modafinil . Yes, 200 mg in AM and 100 mg in PM Wellbutrin  made him moody and he had to stop it.  02/22/2020 appointment with following noted: OCD is getting worse.  Repeated checking rituals around eating, not sure about anything.  So frustrating.  Checks lights when driving.   No reason for it to be worse unless stress but doesn't feel more stressed out. Fatigue and wants thyroid  checked.  No mood swings.  No mania.  Plan start sertraline  up to 100 mg daily for OCD type sx.  04/21/2020 appt noted: Sertraline  100 worked a little for OCD but affecting sex function and concerned about it. Everything else is great. Not having to check things as much but still does.  Still worries.  Does he have keys or schedule.  Doubts about what he is doing.   Taking energy shot drinks bc feels tired. Sleep doc pleased with bipap with good response. Not jittery with energy drinks and doesn't affect sleep. Increase modafinil  to 300 mg daily. Plan: Sertraline  helped OCD some but sexual SE.  Concerns about DDI with clozapine  from fluvoxamine and fluoxetine  which both have probably less sex SE.  Least risk probably with fluoxetine  so will switch to it.  Disc Ddi and risk sedation. Switch sertraline  to fluoxetine  40 mg daily  Reduce sertraline  to 1/2 tablet and start fluoxetine  1of the 20 mg capsule daily for 1 week. Then increase fluoxetine  to 2 capsules daily and stop sertraline .   05/17/2020 phone call: Patient complaining that clozapine  was making him sick excessively sleepy. MD response: He is currently taking clozapine  600 mg in the evening. He reduced the clozapine  dose  in 2017 and gradually had worsening anxiety and paranoia at 500 mg daily.  However if he is willing  to take that risk he can reduce the clozapine  to 500 mg at night.  06/22/2020 appointment with the following noted: Still OCD sx not any better and no different from when on sertraline .  Checks things repeatedly, schedule locks.   Also still drowsy after reducing the clozapine  to 500 mg at night. Seen with wife who said there were couple of instances of paranoia at work.  He mentioned to her a week ago.  She says day to day he seems ok.   Jon concerned about his memory.  He can't remember things and it wears her out.  She has to repeat instructions to him repeatedly.  Can't depend on him to keep up with stuff.   Pulm saw him and his AHI was 0.3. She sees general cognitive decline. Plan: Reduce lamotrigine  to 1-1/2 tablets in the morning to see if it helps memory increase modafinil  to 2 tablets daily Check vitamin B12 and folate level they were normal.  11/16/2020 appointment with the following noted: Will be watching new grandchild in December. Doing good.  Work PT [employer]. Feel like he's not getting enough oxygen.   Sleep variable.  Paranoia under control. Sleep good and using CPAP.  05/17/21 appt noted: CC tired and forgetful. On clozapine  to 600 mg daily.  Asks to reduce. Otherwise good mood and anxiety.   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   Plan: OK trial with reduction clozapine  to 500 mg daily DT fatigue and cognive complaints.     08/17/21 appt noted: Pt called early July asking to reduce clozapine  again to 400 mg .  Disc risk relapse but he wants to pursue anyway so it was agreed. Not as groggy and tired.  Mind can go blank.  Sometimes a  little more overwhelmed but brief.  No paranoia or irritability. M in Law  with [place] and wife there everyday.   PT work very low stress.  No problems dealing with people. Quit smoking and thinks that enabled him to reduce clozapine . Chantix  helped him and off for  4 weeks. Other meds: no lamotrigine , fluoxetine  40, modafinil  200 BID, lihtium 450 BID, propranolol  20 BID and 40 PM. Sleep is pretty good and less groggy on awakening.  At least 8-9 hours.  No sig napping. No falls in a long time until yesterday.  No sig jerks. Plan: OK trial with reduction clozapine  to 400 mg daily DT fatigue and cognive complaints.    12/06/21 appt noted: M in law stage 4 cancer and wife helps care for her. Reduced clozapine  400 mg HS and a little harder to go to sleep and less tired in the day.    To bed 11 and up by 9 AM. No anxiety, anger, irritability or anger being worse. Nothing worse with less clozapine . Doing low stress PT job.   Down to 3-4 cig per day with benefit of Chantix .  If misses Chantix  then strong craving. Less cough and SOB. Drowsiness is not that bad in the day usually.  Drinking less coffee.   Propranolol  helps anxiety and tremor. Uses [employer] Medicare D plan and works well. Focus better in speaking  03/08/22 appt noted: Quit smoking for 2 weeks.  Wife quitting too with patches. Doing good overall.  No paranoia or mood swings.   No problems with meds other than sleepiness.   Still active at church.  Has lost some leaders in church.  His father comes to church some.    04/05/22 TC with staff:  Patient c/o being too  drowsy with 400 mg clozapine . He is asking to cut down to 300 mg. He said he had not smoked in 2 months. He said if he started having sx he would increase it. He currently has RF for 600 mg.     MD resp:  He can reduce clozapine  100 mg tablets from 4 nightly to 3 nightly.  It must be reduced slowly otherwise paranoia will recur.     06/06/22 appt noted: Meds: clozaine 300 mg HS,  No smoking for over 2 mos.  Chantix  helps.   If misses more craving cig.   Wife also quit smoking.  Saving money No problems with reduction in clozapine  to 300 mg HS. Less sleepiness and tiredness.  No mood swings.  No sig paranoia. Anxiety manageable.   No  complaints. Sleep about 11-9.   No depression.  Family doing good.  Parents still living.   Plan: no changes  09/12/22 appt noted: Psych meds: Clozapine  300 nightly, fluoxetine  40 mg daily, lithium  ER 450 twice daily, modafinil  200 mg twice daily, propranolol  20 mg 2 to 3 tablets twice daily as needed for tremor or anxiety, stopped Chantix  1 mg twice daily No smoking for months. Doing pretty good.  Enjoys PT work.  Managed difficult coworker.  GD on the way.   Mood good.  No mood swings.  No complaints.   Sleep about 11-9 .  Benefit with meds.   No SE concerns.  Less sedation with less clozapine . Plan no changes  01/14/23 appt noted:  Psych meds as above  D near delivery of baby.  Will be GD.  She has son 59 mos old. Doing great on meds mentally.   Biggest problem is getting labs and results and clozapine .  May look into lab change bc that is the problem.    05/15/23 appt noted: Med: Clozapine  300 nightly, fluoxetine  40 mg daily, lithium  ER 450 twice daily, modafinil  200 mg twice daily, propranolol  20 mg 2 to 3 tablets twice daily as needed for tremor or anxiety,  No smoking for 8 months. No  SE .  No sig trmeor.  No further falling.   Sleep is ok.  Avg 8-9 hours.   M in law dying in hospital.  He's doing things wife used to do.   Modafinil  still helps.  Tried if misses it.   Plan check B12 and folate.  05/24/23  Lithium  level good 0.6 Normal CBC.  Vitamin A-87675 Folate7.8 Cognitive complaints with low normal b12.  Tell pt to start taking OTC B12 tablet each morning.  It might help his memory a bit. Lorene Macintosh, MD, DFAPA     06/19/23   Normal B12 and folate levels. Lithium  0.5 stable low dose.   No changes indicated.     11/14/23 appt noted: Med: Clozapine  300 nightly, fluoxetine  40 mg daily, lithium  ER 450 twice daily, modafinil  200 mg twice daily, propranolol  20 mg 2 to 3 tablets twice daily as needed for tremor or anxiety,  Normal B12 and folate checked since  here.  11/14/23 appt noted:  Med: Increased Clozapine  400 nightly, fluoxetine  40 mg daily, lithium  ER 450 twice daily, modafinil  200 mg twice daily, propranolol  20 mg 2 to 3 tablets twice daily as needed for tremor or anxiety,  Had to increase clozapine  from 300 to 400 mg HS DT paranoia about intention of others.  Change over a month ago.  As soon as increased then paranoia resolved. Mood and anxiety are ok.   Main  concern is tiredness.   Lethargic.  No napping.  Thinks it is clozapine .   Sleep 1030-9 A.  BiPAP working fine.   Tried B12 but couldn't much much difference.    Kids: 76, 17, 76, 87 GSON sees weekly.  They live in [place].  He reduced the clozapine  dose and 2017 and gradually had worsening anxiety and paranoia at 500 mg daily.  Past Psychiatric Medication Trials:  On clozapine  700 since July 2018 to August 2020.  Chantix  N and irritable and tired.  Tried third time and doing better with it 2024.  Wellbutrin  300 Haldol dystonia , olanzapine for many years, ziprasidone, risperidone,  benzodiazepine dependence remotely, modafinil ,  propranolol ,  fluvoxamine,  Sertraline  100 Aricept no response, Exelon  partial response,  Nuvigil, Provigil  Remote history of abuse of CBZ  Review of Systems:  Review of Systems  Constitutional:  Negative for fatigue.  Respiratory:  Positive for shortness of breath. Negative for cough.   Cardiovascular:  Negative for palpitations.  Genitourinary:        ED and ejac delay  Neurological:  Negative for tremors.       Myoclonic jerks not evident in the office but reported.  Psychiatric/Behavioral:  Positive for decreased concentration. Negative for agitation, behavioral problems, confusion, dysphoric mood, hallucinations, self-injury, sleep disturbance and suicidal ideas. The patient is not nervous/anxious and is not hyperactive.   Not usually short of breath.  Medications: I have reviewed the patient's current medications.  Current Outpatient  Medications  Medication Sig Dispense Refill   cloZAPine  (CLOZARIL ) 100 MG tablet Take 4 tablets (400 mg total) by mouth at bedtime. 120 tablet 2   FLUoxetine  (PROZAC ) 40 MG capsule Take 1 capsule (40 mg total) by mouth daily. 90 capsule 1   lithium  carbonate (ESKALITH ) 450 MG ER tablet Take 2 tablets (900 mg total) by mouth at bedtime. 180 tablet 1   modafinil  (PROVIGIL ) 200 MG tablet TAKE 1 TABLET BY MOUTH IN THE MORNING AND AT 2 PM 180 tablet 1   propranolol  (INDERAL ) 20 MG tablet TAKE 2 TO 3 TABLETS BY MOUTH TWICE DAILY FOR TREMORS OR ANXIETY 540 tablet 1   No current facility-administered medications for this visit.    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  Tobacco Use   Smoking status: Former    Current packs/day: 0.00    Average packs/day: 1 pack/day for 31.4 years (31.4 ttl pk-yrs)    Types: Cigarettes    Start date: 03/12/1990    Quit date: 07/25/2021    Years since quitting: 2.3   Smokeless tobacco: Never   Tobacco comments:    Quit smoking 07/2021  Vaping Use   Vaping status: Never Used  Substance and Sexual Activity   Alcohol use: No   Drug use: No   Sexual activity: Not on file  Other Topics Concern  Not on file  Social History Narrative   Not on file   Social Drivers of Health   Financial Resource Strain: Not on file  Food Insecurity: Not on file  Transportation Needs: Not on file  Physical Activity: Not on  file  Stress: Not on file  Social Connections: Not on file  Intimate Partner Violence: Not on file    Past Medical History, Surgical history, Social history, and Family history were reviewed and updated as appropriate.   Please see review of systems for further details on the patient's review from today.     Objective:   Physical Exam:  There were no vitals taken for this visit.  Physical Exam Constitutional:      General: He is not in acute distress.    Appearance: He is well-developed.  Musculoskeletal:        General: No deformity.  Neurological:     Mental Status: He is alert and oriented to person, place, and time.     Motor: No tremor.     Coordination: Coordination normal.     Gait: Gait normal.  Psychiatric:        Attention and Perception: Attention and perception normal. He is attentive.        Mood and Affect: Mood is not anxious or depressed. Affect is not labile or angry.        Speech: Speech normal.        Behavior: Behavior normal. Behavior is not slowed.        Thought Content: Thought content normal. Thought content is not paranoid or delusional. Thought content does not include homicidal or suicidal ideation. Thought content does not include suicidal plan.        Cognition and Memory: Memory is not impaired. He does not exhibit impaired recent memory.        Judgment: Judgment normal.     Comments: Insight is good. Anxiety is better No paranoia.     Lab Review:     Component Value Date/Time   NA 141 03/09/2007 2256   K 4.1 03/09/2007 2256   CL 105 03/09/2007 2256   CO2 28 03/09/2007 2256   GLUCOSE 107 (H) 03/09/2007 2256   BUN 7 03/09/2007 2256   CREATININE 0.94 03/09/2007 2256   CALCIUM 9.5 03/09/2007 2256   GFRNONAA >60 03/09/2007 2256   GFRAA  03/09/2007 2256    >60        The eGFR has been calculated using the MDRD equation. This calculation has not been validated in all clinical       Component Value Date/Time   WBC 5.2  11/11/2023 1133   WBC 4.7 03/09/2007 2256   RBC 4.89 11/11/2023 1133   RBC 5.06 03/09/2007 2256   HGB 14.6 11/11/2023 1133   HCT 44.0 11/11/2023 1133   PLT 167 11/11/2023 1133   MCV 90 11/11/2023 1133   MCH 29.9 11/11/2023 1133   MCHC 33.2 11/11/2023 1133   MCHC 35.3 03/09/2007 2256   RDW 13.7 11/11/2023 1133   LYMPHSABS 1.4 11/11/2023 1133   MONOABS 0.4 03/09/2007 2256   EOSABS 0.0 11/11/2023 1133   BASOSABS 0.0 11/11/2023 1133    Lithium  Lvl  Date Value Ref Range Status  06/17/2023 0.5 0.5 - 1.2 mmol/L Final    Comment:    A concentration of 0.5-0.8 mmol/L is advised for long-term use; concentrations of up to 1.2 mmol/L may be necessary during acute treatment.  Detection Limit = 0.1                           <0.1 indicates None Detected      Lab Results  Component Value Date   VALPROATE 78.4 03/15/2007    09/25/21 lithium  level 0.8   12/18/22 lithium  level 1.0 on 900 mg daily   .res Assessment: Plan:    Schizoaffective disorder, bipolar type (HCC) - Plan: lithium  carbonate (ESKALITH ) 450 MG ER tablet  Social anxiety disorder - Plan: propranolol  (INDERAL ) 20 MG tablet  Mixed obsessional thoughts and acts - Plan: FLUoxetine  (PROZAC ) 40 MG capsule  Generalized anxiety disorder  Long term current use of clozapine   Lithium  use  Obstructive sleep apnea - Plan: modafinil  (PROVIGIL ) 200 MG tablet  Mild cognitive impairment  Myoclonic jerking   Disc smoking cessation.  He's stopped completely with Chantix  for 8 mos been off for mos.    His schizoaffective disorder symptoms including paranoia and irritability are well controlled with the current medications.  Including the clozapine .  His CBC with differential has been stable and unremarkable.  These have been recorded in the paper chart because it is difficult to send the results to the pharmacy and our REMS through epic we discussed the side effects of clozapine  in detail.    We  have tried to mitigate excessive tiredness from clozapine  that through the use of modafinil  or Nuvigil with partial success.Consider switch to Sunosi. Helped to increase modafinil  400 mg daily.  He's using CPAP regularly. Dr. Shellia checked it out.  For SOB see PCP.  He has mild cognitive impairment is better with less clozapine  and modafinil  but still a px.  Trial B12 for memeory concerns.Consider memantine for cognition if not better.  Sertraline  helped OCD some but sexual SE.  Concerns about DDI with clozapine  from fluvoxamine and fluoxetine  which both have probably less sex SE.  Least risk probably with fluoxetine  so will switch to it.  Disc Ddi and risk sedation. Continue fluoxetine  40 mg daily  He was having myoclonic jerks in legs and arms likely from clozapine  but better and no falls..   Myoclonic jerks are better nearly gone.    OK with DC lamotrigine    ANC levels are within normal limits.  Disc new guidelines.  Can stop monthly labs.  TSH and lithium  level DT Fatigue.  Done and is OK normal B12 and folate DT worsening cognition June 2022 He and this office are having difficulties dealing with his current lab which is an independent laboratory that does not send results in epic. 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  Sx resolved with increase to 400 mg HS    Disc relapse risk.  His paranoia had worsened at 400 mg daily previously but DC smoking is probably helping [facility].  We discussed the risk of worsening paranoia if we reduce the dosage.   He agrees [employer] Systems [employer] go lower. Consider one of the newer antipsychotics.  But there is sig risk relapse.    Counseled patient regarding potential benefits, risks, and side effects of lithium  to include potential risk of lithium  affecting thyroid  and renal function.  Discussed need for periodic lab monitoring to determine drug level and to assess for potential adverse  effects.  Counseled patient regarding signs and symptoms of lithium  toxicity and advised that they notify office immediately or seek urgent medical attention  if experiencing these signs and symptoms.  Patient advised to contact office with any questions or concerns. 12/18/22 lithium  level 1.0 on 900 mg daily  No med changes:   Follow-up 6 mos  Lorene Macintosh, MD, DFAPA   No future appointments.         -------------------------------

## 2024-02-04 ENCOUNTER — Other Ambulatory Visit: Payer: Self-pay | Admitting: Psychiatry

## 2024-02-04 DIAGNOSIS — F250 Schizoaffective disorder, bipolar type: Secondary | ICD-10-CM

## 2024-05-14 ENCOUNTER — Ambulatory Visit (INDEPENDENT_AMBULATORY_CARE_PROVIDER_SITE_OTHER): Admitting: Psychiatry
